# Patient Record
Sex: Female | Born: 1975 | Race: Black or African American | Hispanic: No | Marital: Married | Smoking: Never smoker
Health system: Southern US, Community
[De-identification: ages and names within clinical notes are randomized; demographics above are authoritative.]

## PROBLEM LIST (undated history)

## (undated) ENCOUNTER — Inpatient Hospital Stay (HOSPITAL_COMMUNITY): Payer: Self-pay

## (undated) DIAGNOSIS — B54 Unspecified malaria: Secondary | ICD-10-CM

## (undated) DIAGNOSIS — I10 Essential (primary) hypertension: Secondary | ICD-10-CM

## (undated) HISTORY — DX: Essential (primary) hypertension: I10

## (undated) HISTORY — PX: NO PAST SURGERIES: SHX2092

---

## 2015-09-30 ENCOUNTER — Emergency Department (HOSPITAL_COMMUNITY)
Admission: EM | Admit: 2015-09-30 | Discharge: 2015-09-30 | Disposition: A | Discharge status: Home / Self Care | Payer: Self-pay | Attending: Emergency Medicine | Admitting: Emergency Medicine

## 2015-09-30 ENCOUNTER — Encounter (HOSPITAL_COMMUNITY): Payer: Self-pay | Admitting: *Deleted

## 2015-09-30 ENCOUNTER — Emergency Department (HOSPITAL_COMMUNITY): Payer: Self-pay

## 2015-09-30 DIAGNOSIS — R000 Tachycardia, unspecified: Secondary | ICD-10-CM | POA: Insufficient documentation

## 2015-09-30 DIAGNOSIS — J111 Influenza due to unidentified influenza virus with other respiratory manifestations: Secondary | ICD-10-CM | POA: Insufficient documentation

## 2015-09-30 DIAGNOSIS — M549 Dorsalgia, unspecified: Secondary | ICD-10-CM | POA: Insufficient documentation

## 2015-09-30 DIAGNOSIS — R69 Illness, unspecified: Secondary | ICD-10-CM

## 2015-09-30 HISTORY — DX: Unspecified malaria: B54

## 2015-09-30 LAB — COMPREHENSIVE METABOLIC PANEL
ALK PHOS: 117 U/L (ref 38–126)
ALT: 53 U/L (ref 14–54)
AST: 37 U/L (ref 15–41)
Albumin: 3.6 g/dL (ref 3.5–5.0)
Anion gap: 11 (ref 5–15)
BILIRUBIN TOTAL: 0.6 mg/dL (ref 0.3–1.2)
BUN: 7 mg/dL (ref 6–20)
CALCIUM: 8.9 mg/dL (ref 8.9–10.3)
CO2: 23 mmol/L (ref 22–32)
CREATININE: 0.66 mg/dL (ref 0.44–1.00)
Chloride: 105 mmol/L (ref 101–111)
Glucose, Bld: 101 mg/dL — ABNORMAL HIGH (ref 65–99)
Potassium: 3.4 mmol/L — ABNORMAL LOW (ref 3.5–5.1)
Sodium: 139 mmol/L (ref 135–145)
TOTAL PROTEIN: 7 g/dL (ref 6.5–8.1)

## 2015-09-30 LAB — CBC WITH DIFFERENTIAL/PLATELET
Basophils Absolute: 0 10*3/uL (ref 0.0–0.1)
Basophils Relative: 1 %
EOS PCT: 19 %
Eosinophils Absolute: 1.4 10*3/uL — ABNORMAL HIGH (ref 0.0–0.7)
HEMATOCRIT: 39 % (ref 36.0–46.0)
HEMOGLOBIN: 13.7 g/dL (ref 12.0–15.0)
LYMPHS ABS: 1.5 10*3/uL (ref 0.7–4.0)
LYMPHS PCT: 20 %
MCH: 30.6 pg (ref 26.0–34.0)
MCHC: 35.1 g/dL (ref 30.0–36.0)
MCV: 87.1 fL (ref 78.0–100.0)
Monocytes Absolute: 0.5 10*3/uL (ref 0.1–1.0)
Monocytes Relative: 7 %
Neutro Abs: 3.8 10*3/uL (ref 1.7–7.7)
Neutrophils Relative %: 53 %
PLATELETS: 199 10*3/uL (ref 150–400)
RBC: 4.48 MIL/uL (ref 3.87–5.11)
RDW: 12.5 % (ref 11.5–15.5)
WBC: 7.2 10*3/uL (ref 4.0–10.5)

## 2015-09-30 LAB — PROTIME-INR
INR: 1.14 (ref 0.00–1.49)
PROTHROMBIN TIME: 14.7 s (ref 11.6–15.2)

## 2015-09-30 MED ORDER — SODIUM CHLORIDE 0.9 % IV BOLUS (SEPSIS)
1000.0000 mL | Freq: Once | INTRAVENOUS | Status: AC
  Administered 2015-09-30: 1000 mL via INTRAVENOUS

## 2015-09-30 NOTE — ED Notes (Addendum)
Used translator phone: pt just arrived to [place] 4 days ago from [place], having non productive cough/cold symptoms and lower back pain since this am and possible fever. Denies n/v or urinary symptoms. Mask on pt at triage.

## 2015-09-30 NOTE — ED Provider Notes (Signed)
CSN: [id number]     Arrival date & time 09/30/15  1245 History   First MD Initiated Contact with Patient 09/30/15 1327     Chief Complaint  Patient presents with  . Cough  . Back Pain     (Consider location/radiation/quality/duration/timing/severity/associated sxs/prior Treatment) HPI Comments: Patient with past history of malaria, recently emigrated to the [place] from a refugee camp in [place] on 09/24/15 -- presents with complaint of myalgias, headache, intermittent fever and chills starting this morning. No URI symptoms. Patient does have minor cough and complains of shortness of breath. Myalgias are more severe in her back. She denies skin rash. No nausea, vomiting, abdominal pain or diarrhea. No urinary symptoms. No treatments prior to arrival. No known sick contacts. Patient states that she did receive immunizations prior to travel.  Patient is a 40 y.o. female presenting with cough and back pain. The history is provided by the patient. A language interpreter was used (telephone, swahili).  Cough Associated symptoms: chills, fever, headaches, myalgias and shortness of breath   Associated symptoms: no chest pain, no rash, no rhinorrhea and no sore throat   Back Pain Associated symptoms: fever and headaches   Associated symptoms: no abdominal pain, no chest pain and no dysuria     Past Medical History  Diagnosis Date  . Malaria    History reviewed. No pertinent past surgical history. History reviewed. No pertinent family history. Social History  Substance Use Topics  . Smoking status: None  . Smokeless tobacco: None  . Alcohol Use: No   OB History    No data available     Review of Systems  Constitutional: Positive for fever and chills.  HENT: Negative for rhinorrhea and sore throat.   Eyes: Negative for redness.  Respiratory: Positive for cough and shortness of breath.   Cardiovascular: Negative for chest pain and leg swelling.  Gastrointestinal: Negative for  nausea, vomiting, abdominal pain and diarrhea.  Genitourinary: Negative for dysuria.  Musculoskeletal: Positive for myalgias and back pain.  Skin: Negative for rash.  Neurological: Positive for headaches.    Allergies  Review of patient's allergies indicates no known allergies.  Home Medications   Prior to Admission medications   Medication Sig Start Date End Date Taking? Authorizing Provider  PRESCRIPTION MEDICATION Take 1 tablet by mouth 2 (two) times daily.   Yes Historical Provider, MD   BP 165/119 mmHg  Pulse 103  Temp(Src) 98.7 F (37.1 C) (Oral)  Resp 18  SpO2 95%  LMP 09/16/2015   Physical Exam  Constitutional: She appears well-developed and well-nourished.  HENT:  Head: Normocephalic and atraumatic.  Mouth/Throat: Oropharynx is clear and moist.  Eyes: Right eye exhibits no discharge. Left eye exhibits no discharge.  Mild scleral injection.   Neck: Normal range of motion. Neck supple.  Cardiovascular: Regular rhythm and normal heart sounds.  Tachycardia present.   No murmur heard. Pulmonary/Chest: Effort normal and breath sounds normal. No respiratory distress. She has no wheezes. She has no rales.  Abdominal: Soft. There is no tenderness.  Neurological: She is alert.  Skin: Skin is warm and dry.  Psychiatric: She has a normal mood and affect.  Nursing note and vitals reviewed.   ED Course  Procedures (including critical care time) Labs Review Labs Reviewed  CBC WITH DIFFERENTIAL/PLATELET - Abnormal; Notable for the following:    Eosinophils Absolute 1.4 (*)    All other components within normal limits  COMPREHENSIVE METABOLIC PANEL - Abnormal; Notable for the following:  Potassium 3.4 (*)    Glucose, Bld 101 (*)    All other components within normal limits  MALARIA SMEAR  PROTIME-INR    Imaging Review Dg Chest 2 View  09/30/2015  CLINICAL DATA:  Nonproductive cough.  Cold symptoms. EXAM: CHEST  2 VIEW COMPARISON:  None. FINDINGS: Normal cardiac  silhouette and mediastinal contours. There is mild diffuse slightly nodular thickening of the pulmonary interstitium. No focal parenchymal opacities. There is minimal pleural parenchymal thickening about the bilateral major fissures. No pleural effusion or pneumothorax. No evidence of edema. No acute osseus abnormalities. IMPRESSION: Findings suggestive of airways disease. No focal airspace opacities to suggest pneumonia. Electronically Signed   By: Simonne Come M.D.   On: 09/30/2015 13:26   I have personally reviewed and evaluated these images and lab results as part of my medical decision-making.   EKG Interpretation None       1:46 PM Patient seen and examined. Work-up initiated. Medications ordered.   Vital signs reviewed and are as follows: BP 165/119 mmHg  Pulse 103  Temp(Src) 98.7 F (37.1 C) (Oral)  Resp 18  SpO2 95%  LMP 09/16/2015  4:11 PM Continuing to await preliminary Palladium smear.   Handoff to [facility] PA-C/Dr. Jeraldine Loots at shift change.   If neg, d/c to home with treatment for flu-like illness. Patient is non-toxic in appearance.    MDM   Final diagnoses:  None   Pending completion of work-up.     Renne Crigler, PA-C 09/30/15 1616  Arby Barrette, MD 10/07/15 [phone]

## 2015-09-30 NOTE — ED Notes (Signed)
Case manager from refugee organization [place] [phone] can be contacted for any questions/concerns and also for transportation home on discharge

## 2015-09-30 NOTE — Discharge Instructions (Signed)
The preliminary malaria test is negative.  Additional testing has been sent out.  You will be called if this is positive.  Influenza, Adult Influenza ("the flu") is a viral infection of the respiratory tract. It occurs more often in winter months because people spend more time in close contact with one another. Influenza can make you feel very sick. Influenza easily spreads from person to person (contagious). CAUSES  Influenza is caused by a virus that infects the respiratory tract. You can catch the virus by breathing in droplets from an infected person's cough or sneeze. You can also catch the virus by touching something that was recently contaminated with the virus and then touching your mouth, nose, or eyes. RISKS AND COMPLICATIONS You may be at risk for a more severe case of influenza if you smoke cigarettes, have diabetes, have chronic heart disease (such as heart failure) or lung disease (such as asthma), or if you have a weakened immune system. Elderly people and pregnant women are also at risk for more serious infections. The most common problem of influenza is a lung infection (pneumonia). Sometimes, this problem can require emergency medical care and may be life threatening. SIGNS AND SYMPTOMS  Symptoms typically last 4 to 10 days and may include:  Fever.  Chills.  Headache, body aches, and muscle aches.  Sore throat.  Chest discomfort and cough.  Poor appetite.  Weakness or feeling tired.  Dizziness.  Nausea or vomiting. DIAGNOSIS  Diagnosis of influenza is often made based on your history and a physical exam. A nose or throat swab test can be done to confirm the diagnosis. TREATMENT  In mild cases, influenza goes away on its own. Treatment is directed at relieving symptoms. For more severe cases, your health care provider may prescribe antiviral medicines to shorten the sickness. Antibiotic medicines are not effective because the infection is caused by a virus, not by  bacteria. HOME CARE INSTRUCTIONS  Take medicines only as directed by your health care provider.  Use a cool mist humidifier to make breathing easier.  Get plenty of rest until your temperature returns to normal. This usually takes 3 to 4 days.  Drink enough fluid to keep your urine clear or pale yellow.  Cover yourmouth and nosewhen coughing or sneezing,and wash your handswellto prevent thevirusfrom spreading.  Stay homefromwork orschool untilthe fever is gonefor at least 66full day. PREVENTION  An annual influenza vaccination (flu shot) is the best way to avoid getting influenza. An annual flu shot is now routinely recommended for all adults in the [place] SEEK MEDICAL CARE IF:  You experiencechest pain, yourcough worsens,or you producemore mucus.  Youhave nausea,vomiting, ordiarrhea.  Your fever returns or gets worse. SEEK IMMEDIATE MEDICAL CARE IF:  You havetrouble breathing, you become short of breath,or your skin ornails becomebluish.  You have severe painor stiffnessin the neck.  You develop a sudden headache, or pain in the face or ear.  You have nausea or vomiting that you cannot control. MAKE SURE YOU:   Understand these instructions.  Will watch your condition.  Will get help right away if you are not doing well or get worse.   This information is not intended to replace advice given to you by your health care provider. Make sure you discuss any questions you have with your health care provider.   Document Released: 08/16/2000 Document Revised: 09/09/2014 Document Reviewed: 11/18/2011 Elsevier Interactive Patient Education [employer]

## 2015-09-30 NOTE — ED Provider Notes (Signed)
4:00 PM Patient signed out to me at shift change by Rhea Bleacher, PA-C.  Malaria smear pending.  Plan is to consult ID if Malaria smear is positive.  Plan is to discharge home with Dx of Influenza like illness if smear is negative.  5:00 PM Lab reports that the Malaria smear is negative.  Discussed results with the patient using phone interpretor.  Patient stable for discharge.  Return precautions given.  Santiago Glad, PA-C 10/01/15 [id number]  Gerhard Munch, MD 10/01/15 [phone]

## 2015-10-02 ENCOUNTER — Encounter (HOSPITAL_COMMUNITY): Payer: Self-pay | Admitting: Emergency Medicine

## 2015-10-02 ENCOUNTER — Emergency Department (HOSPITAL_COMMUNITY)
Admission: EM | Admit: 2015-10-02 | Discharge: 2015-10-02 | Disposition: A | Discharge status: Home / Self Care | Payer: Self-pay | Attending: Emergency Medicine | Admitting: Emergency Medicine

## 2015-10-02 ENCOUNTER — Emergency Department (HOSPITAL_COMMUNITY): Payer: Self-pay

## 2015-10-02 DIAGNOSIS — Z8613 Personal history of malaria: Secondary | ICD-10-CM | POA: Insufficient documentation

## 2015-10-02 DIAGNOSIS — R0600 Dyspnea, unspecified: Secondary | ICD-10-CM | POA: Insufficient documentation

## 2015-10-02 DIAGNOSIS — R05 Cough: Secondary | ICD-10-CM | POA: Insufficient documentation

## 2015-10-02 DIAGNOSIS — R062 Wheezing: Secondary | ICD-10-CM | POA: Insufficient documentation

## 2015-10-02 DIAGNOSIS — I10 Essential (primary) hypertension: Secondary | ICD-10-CM | POA: Insufficient documentation

## 2015-10-02 DIAGNOSIS — Z79899 Other long term (current) drug therapy: Secondary | ICD-10-CM | POA: Insufficient documentation

## 2015-10-02 DIAGNOSIS — Z3202 Encounter for pregnancy test, result negative: Secondary | ICD-10-CM | POA: Insufficient documentation

## 2015-10-02 LAB — I-STAT BETA HCG BLOOD, ED (MC, WL, AP ONLY)

## 2015-10-02 LAB — CBC
HCT: 42.2 % (ref 36.0–46.0)
Hemoglobin: 14.6 g/dL (ref 12.0–15.0)
MCH: 30.4 pg (ref 26.0–34.0)
MCHC: 34.6 g/dL (ref 30.0–36.0)
MCV: 87.7 fL (ref 78.0–100.0)
Platelets: 210 10*3/uL (ref 150–400)
RBC: 4.81 MIL/uL (ref 3.87–5.11)
RDW: 12.4 % (ref 11.5–15.5)
WBC: 7.6 10*3/uL (ref 4.0–10.5)

## 2015-10-02 LAB — BASIC METABOLIC PANEL
Anion gap: 9 (ref 5–15)
BUN: 8 mg/dL (ref 6–20)
CO2: 26 mmol/L (ref 22–32)
Calcium: 9 mg/dL (ref 8.9–10.3)
Chloride: 104 mmol/L (ref 101–111)
Creatinine, Ser: 0.61 mg/dL (ref 0.44–1.00)
GFR calc Af Amer: >60 mL/min (ref >60)
GFR calc non Af Amer: >60 mL/min (ref >60)
Glucose, Bld: 98 mg/dL (ref 65–99)
Potassium: 3.3 mmol/L — ABNORMAL LOW (ref 3.5–5.1)
Sodium: 139 mmol/L (ref 135–145)

## 2015-10-02 LAB — D-DIMER, QUANTITATIVE: D-Dimer, Quant: 0.46 ug/mL-FEU (ref 0.00–0.50)

## 2015-10-02 MED ORDER — ALBUTEROL SULFATE HFA 108 (90 BASE) MCG/ACT IN AERS
2.0000 | INHALATION_SPRAY | Freq: Once | RESPIRATORY_TRACT | Status: AC
  Administered 2015-10-02: 2 via RESPIRATORY_TRACT
  Filled 2015-10-02: qty 6.7

## 2015-10-02 MED ORDER — DEXAMETHASONE SODIUM PHOSPHATE 10 MG/ML IJ SOLN
10.0000 mg | Freq: Once | INTRAMUSCULAR | Status: AC
  Administered 2015-10-02: 10 mg via INTRAMUSCULAR
  Filled 2015-10-02: qty 1

## 2015-10-02 MED ORDER — IPRATROPIUM-ALBUTEROL 0.5-2.5 (3) MG/3ML IN SOLN
3.0000 mL | Freq: Once | RESPIRATORY_TRACT | Status: AC
  Administered 2015-10-02: 3 mL via RESPIRATORY_TRACT
  Filled 2015-10-02: qty 3

## 2015-10-02 MED ORDER — IBUPROFEN 200 MG PO TABS
600.0000 mg | ORAL_TABLET | Freq: Once | ORAL | Status: AC
  Administered 2015-10-02: 600 mg via ORAL
  Filled 2015-10-02: qty 3

## 2015-10-02 MED ORDER — HYDROCHLOROTHIAZIDE 12.5 MG PO CAPS
25.0000 mg | ORAL_CAPSULE | Freq: Once | ORAL | Status: AC
  Administered 2015-10-02: 25 mg via ORAL
  Filled 2015-10-02: qty 2

## 2015-10-02 MED ORDER — HYDROCHLOROTHIAZIDE 25 MG PO TABS: 25.0000 mg | ORAL_TABLET | Freq: Every day | ORAL | Status: DC

## 2015-10-02 NOTE — Discharge Instructions (Signed)
Read the information below.  Use the prescribed medication as directed.  Please discuss all new medications with your pharmacist.  You may return to the Emergency Department at any time for worsening condition or any new symptoms that concern you.    If you develop worsening chest pain, shortness of breath, fever, you pass out, or become weak or dizzy, return to the ER for a recheck.      Kusoma maelezo hapa chini. Kutumia dawa eda kama ilivyoagizwa. Tafadhali kujadili dawa zote mpya na mfamasia wako. [place] Curet kurudi [place] ya Dharura wakati wowote kwa hali au dalili yoyote mpya ambayo wasiwasi wewe mbaya. Kama wewe Drexel Iha hali mbaya ya maumivu ya kifua, upungufu wa kupumua, homa, kupita nje, au kuwa dhaifu au [place], kurudi ER kwa [place] tena.  Hypertension Hypertension is another name for high blood pressure. High blood pressure forces your heart to work harder to pump blood. A blood pressure reading has two numbers, which includes a higher number over a lower number (example: 110/72). HOME CARE   Have your blood pressure rechecked by your doctor.  Only take medicine as told by your doctor. Follow the directions carefully. The medicine does not work as well if you skip doses. Skipping doses also puts you at risk for problems.  Do not smoke.  Monitor your blood pressure at home as told by your doctor. GET HELP IF:  You think you are having a reaction to the medicine you are taking.  You have repeat headaches or feel dizzy.  You have puffiness (swelling) in your ankles.  You have trouble with your vision. GET HELP RIGHT AWAY IF:   You get a very bad headache and are confused.  You feel weak, numb, or faint.  You get chest or belly (abdominal) pain.  You throw up (vomit).  You cannot breathe very well. MAKE SURE YOU:   Understand these instructions.  Will watch your condition.  Will get help right away if you are not doing well or get worse.   This information  is not intended to replace advice given to you by your health care provider. Make sure you discuss any questions you have with your health care provider.   Document Released: 02/05/2008 Document Revised: 08/24/2013 Document Reviewed: 06/11/2013 Elsevier Interactive Patient Education [employer]

## 2015-10-02 NOTE — ED Notes (Signed)
Monique Moon [phone] Case manager brought pt and her husband in.  Pt and her husband have lived in [place] refugee [place] 19 years.  Pt speaks swahili.  Translator phone used.  Husband speaking for patient.  Husband was instructed to allow pt to talk.   Pt states that she was seen a few days ago for few like symptoms.  Pt states that she now feels worse with [facility] and chest pain.  Pt is speaking in full sentences and does not appear to be in any distress.  Pt was worried that she has malaria but was tested for this at [facility].  Pt's case manager will return when called to come pick pt up.

## 2015-10-02 NOTE — Progress Notes (Signed)
Entered in d/c instructions Translated from [place] to Swahili  refugee case Patent examiner from refugee organization [place] [phone] can be contacted for any questions/concerns and also for transportation home on discharge   wito---wakimbizi meneja wa kesi Uchunguzi Foye Spurling shirika Ancil Boozer [phone] inaweza kupatikana kwa maswali yoyote / matatizo na pia kwa ajili ya usafiri wa nyumbani juu Monique Moon   [facility]) [address] [place], [place] [address] [phone] na 2:00 Jumatatu-Ijumaa & saini Unice Bailey dawati la mbele. [address] unahitaji [place], [place], upatikanaji wa [place], ushauri [place], resume [place], rufaa kwa ajili ya chakula / [place], Afy

## 2015-10-02 NOTE — Progress Notes (Signed)
Discussed patient with EDRN.

## 2015-10-02 NOTE — ED Provider Notes (Signed)
CSN: [id number]     Arrival date & time 10/02/15  1151 History   First MD Initiated Contact with Patient 10/02/15 1545     Chief Complaint  Patient presents with  . Chest Pain  . Shortness of Breath  . Fatigue     (Consider location/radiation/quality/duration/timing/severity/associated sxs/prior Treatment) Patient is a 40 y.o. female presenting with chest pain and shortness of breath. The history is provided by the patient and the spouse. The history is limited by a language barrier. A language interpreter was used Multimedia programmer - Swahili).  Chest Pain Associated symptoms: shortness of breath   Shortness of Breath Associated symptoms: chest pain      Pt with hx malaria recently arrived from refugee camp in [place] presents with chest soreness and SOB, dry cough, body aches with coughing that began two days ago.  Was seen in the ED two days ago and diagnosed with influenza-like illness.  During ED visit pt had negative malaria smear, negative CXR.  Deny any hx pulmonary or cardiac problems previously, denies family hx cardiac problems.    Past Medical History  Diagnosis Date  . Malaria    History reviewed. No pertinent past surgical history. History reviewed. No pertinent family history. Social History  Substance Use Topics  . Smoking status: Never Smoker   . Smokeless tobacco: None  . Alcohol Use: No   OB History    No data available     Review of Systems  Respiratory: Positive for shortness of breath.   Cardiovascular: Positive for chest pain.  All other systems reviewed and are negative.     Allergies  Review of patient's allergies indicates no known allergies.  Home Medications   Prior to Admission medications   Medication Sig Start Date End Date Taking? Authorizing Provider  PRESCRIPTION MEDICATION Take 1 tablet by mouth 2 (two) times daily.    Historical Provider, MD   BP 167/117 mmHg  Pulse 102  Temp(Src) 99.6 F (37.6 C) (Oral)  Resp 17  SpO2  98%  LMP 09/16/2015 Physical Exam  Constitutional: She appears well-developed and well-nourished. No distress.  HENT:  Head: Normocephalic and atraumatic.  Neck: Neck supple.  Cardiovascular: Normal rate and regular rhythm.   Pulmonary/Chest: Effort normal. No respiratory distress. She has wheezes. She has no rales.  Abdominal: Soft. She exhibits no distension. There is no tenderness. There is no rebound and no guarding.  Musculoskeletal: She exhibits no edema.  Neurological: She is alert.  Skin: She is not diaphoretic.  Nursing note and vitals reviewed.   ED Course  Procedures (including critical care time) Labs Review Labs Reviewed  BASIC METABOLIC PANEL - Abnormal; Notable for the following:    Potassium 3.3 (*)    All other components within normal limits  CBC  D-DIMER, QUANTITATIVE (NOT AT [facility])  I-STAT BETA HCG BLOOD, ED ([facility], [facility], [facility] ONLY)    Imaging Review Dg Chest 2 View  10/02/2015  CLINICAL DATA:  Shortness of breath, chest pain EXAM: CHEST  2 VIEW COMPARISON:  None. FINDINGS: The heart size and mediastinal contours are within normal limits. Both lungs are clear. The visualized skeletal structures are unremarkable. IMPRESSION: No active cardiopulmonary disease. Electronically Signed   By: Elige Ko   On: 10/02/2015 12:58   I have personally reviewed and evaluated these images and lab results as part of my medical decision-making.   EKG Interpretation   Date/Time:  Monday October 02 2015 12:29:33 EST Ventricular Rate:  104 PR Interval:  180 QRS Duration: 77 QT Interval:  336 QTC Calculation: 442 R Axis:   62 Text Interpretation:  Sinus tachycardia Consider right atrial enlargement  Probable LVH with secondary repol abnrm Anterolateral Q wave, probably  normal for age Confirmed by Juleen China  MD, STEPHEN ([id number]) on 10/02/2015 6:52:28  PM       5:49 PM Pt reports improvement of breathing.  Lungs now CTAB.  Would like second treatment.  They are unaware of refugee  clinic.    MDM   Final diagnoses:  Dyspnea  Essential hypertension    Nontoxic patient with 3 days of dry cough, SOB, chest soreness.  Seen in ED two days ago with negative workup, thought to be influenza-like illness.  Malaria smear was negative.  Workup today also reassuring.  D-dimer negative.  Pt wheezing on arrival today and feeling better after neb treatment.  Pt d/c home with albuterol hfa,, HCTZ for elevated blood pressure (persistent through both ED visits), close follow up in the local refugee clinic. Suspect viral cause.  Resources given by case Production designer, theatre/television/film. Discussed pt, workup, and plan with Dr Juleen China.    D/C home.  Discussed result, findings, treatment, and follow up  with patient.  Pt given return precautions.  Pt verbalizes understanding and agrees with plan.        Trixie Dredge, PA-C 10/03/15 [id number]  Raeford Razor, MD 10/04/15 [phone]

## 2015-10-02 NOTE — Progress Notes (Signed)
ED CM found this EPIC note from 09/30/15  Case manager from refugee organization [place] [phone] can be contacted for any questions/concerns and also for transportation home on discharge

## 2015-10-05 LAB — MALARIA SMEAR: Special Requests: NORMAL

## 2016-02-07 ENCOUNTER — Ambulatory Visit (INDEPENDENT_AMBULATORY_CARE_PROVIDER_SITE_OTHER): Payer: Medicaid Other | Admitting: Family Medicine

## 2016-02-07 VITALS — BP 147/98 | HR 80 | Temp 97.6°F | Ht 64.0 in | Wt 179.2 lb

## 2016-02-07 DIAGNOSIS — E669 Obesity, unspecified: Secondary | ICD-10-CM | POA: Diagnosis not present

## 2016-02-07 DIAGNOSIS — Z008 Encounter for other general examination: Secondary | ICD-10-CM | POA: Diagnosis not present

## 2016-02-07 DIAGNOSIS — Z758 Other problems related to medical facilities and other health care: Secondary | ICD-10-CM | POA: Insufficient documentation

## 2016-02-07 DIAGNOSIS — Z789 Other specified health status: Secondary | ICD-10-CM | POA: Diagnosis not present

## 2016-02-07 DIAGNOSIS — I10 Essential (primary) hypertension: Secondary | ICD-10-CM | POA: Diagnosis present

## 2016-02-07 DIAGNOSIS — Z0289 Encounter for other administrative examinations: Secondary | ICD-10-CM

## 2016-02-07 MED ORDER — HYDROCHLOROTHIAZIDE 25 MG PO TABS: 25.0000 mg | ORAL_TABLET | Freq: Every day | ORAL | Status: DC

## 2016-02-07 NOTE — Patient Instructions (Addendum)
Thank you for coming in to clinic today.  It was good to meet you. [facility]  For your next visit in 6 weeks on Wednesday July 19th at 1:30pm for next visit. We will do blood work and re-check blood pressure.  If you have any other questions or concerns, please feel free to call the clinic to contact me. You may also schedule an earlier appointment if necessary.  Saralyn PilarAlexander Karamalegos, DO [facility]

## 2016-02-07 NOTE — Assessment & Plan Note (Signed)
Elevated BP today but improved from prior 160/100, seems improved on HCTZ, seems adherent.  Plan: 1. Refilled HCTZ 25mg  daily 2. Provided brief lifestyle counseling on low salt diet and some regular physical activity 3. Follow-up repeat BP in 6 weeks, if still elevated consider adding 2nd agent with likely CCB vs ACEi

## 2016-02-07 NOTE — Progress Notes (Signed)
Swahili interpreter (Josias Gasitha, [facility]) utilized during today's visit.  Immigrant Clinic New Patient Visit  HPI:  [facility] for a new patient appointment to establish general primary care. No additional complaints.  CHRONIC HTN: Reports no prior known history HTN. No know family history of HTN. Last seen in [facility] 09/2015 had elevated BP at that time 160/110. Given rx HCTZ at that time. Current Meds - HCTZ 25mg   Reports good compliance, took meds today. Tolerating well, w/o complaints. Lifestyle - no regular exercise Denies CP, dyspnea, HA, edema, dizziness / lightheadedness  ROS: See HPI  Immigrant Social History: - Name spelling correct?: Yes - confirmed with ID card - Date arrived in [place]: 09/27/15 - Country of origin: [place] - Refugee - Location of refugee camp (if applicable), how long there, and what caused patient to leave home country?: [place] with family - Primary language: Swahili  -Requires intepreter (essentially speaks no [place]) - Education: Highest level of education: None (did not attend school) - 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  No  - if yes:  Are you having bad dreams about your experience?     Do you feel "jumpy" or "nervous?"     Do you feel that the experience is happening again?     Are you "super alert" or watchful?   Preventative Care History: -Seen at health department?: No (did not have intake labs)  Past Medical Hx:  - None prior - No hospitalizations  Past Surgical Hx:  - None  OB Hx: G9P9, all NSVD  Family Hx: updated in Epic - Number of family members:  [phone] others, husband 9 children (ages 2 yr to 3919 yr, 1 grandchild - Number of family  members in [place]:  [phone] (no other relatives)  PHYSICAL EXAM: BP 147/98 mmHg  Pulse 80  Temp(Src) 97.6 F (36.4 C) (Oral)  Ht 5\' 4"  (1.626 m)  Wt 179 lb 3.2 oz (81.285 kg)  BMI 30.74 kg/m2  LMP 02/03/2016 Gen: well-appearing, 40 yr female, obese, comfortable, cooperative HEENT: NCAT, conjunctiva clear w/o discharge, PERRL, no pallor, nares patent w/o congestion, moist mucus mem Neck:  Supple Heart: Regular rate and rhythm, no murmurs Lungs: CTAB, good air movement, no wheezing or crackles Skin:  Warm, dry Neuro: Awake, alert  Examined and interviewed with Dr. Gwendolyn GrantWalden  Problem List Items Addressed This Visit    Refugee health examination   Obesity   Language barrier   HTN (hypertension) - Primary    Elevated BP today but improved from prior 160/100, seems improved on HCTZ, seems adherent.  Plan: 1. Refilled HCTZ 25mg  daily 2. Provided brief lifestyle counseling on low salt diet and some regular physical activity 3. Follow-up repeat BP in 6 weeks, if still elevated consider adding 2nd agent with likely CCB vs ACEi      Relevant Medications   hydrochlorothiazide (HYDRODIURIL) 25 MG tablet      FOLLOW UP: F/u in 6 weeks on 03/20/16 for initial labs, BP re-check - Will need the following labs ordered at next visit: UA, Urine NAAT (GC/Chlamydia), Sickle cell screen, HIV, RPR, Hep B surface Ag, Hep C ab,  TB Quantiferon gold  Saralyn PilarAlexander Stepehn Eckard, DO [facility], PGY-3

## 2016-03-20 ENCOUNTER — Ambulatory Visit: Payer: Medicaid Other

## 2016-06-07 ENCOUNTER — Inpatient Hospital Stay (HOSPITAL_COMMUNITY)
Admission: AD | Admit: 2016-06-07 | Discharge: 2016-06-07 | Disposition: A | Discharge status: Home / Self Care | Payer: Medicaid Other | Source: Ambulatory Visit | Attending: Obstetrics & Gynecology | Admitting: Obstetrics & Gynecology

## 2016-06-07 ENCOUNTER — Encounter (HOSPITAL_COMMUNITY): Payer: Self-pay | Admitting: *Deleted

## 2016-06-07 DIAGNOSIS — Z3689 Encounter for other specified antenatal screening: Secondary | ICD-10-CM

## 2016-06-07 DIAGNOSIS — O10011 Pre-existing essential hypertension complicating pregnancy, first trimester: Secondary | ICD-10-CM | POA: Diagnosis not present

## 2016-06-07 DIAGNOSIS — I10 Essential (primary) hypertension: Secondary | ICD-10-CM | POA: Diagnosis present

## 2016-06-07 DIAGNOSIS — O10919 Unspecified pre-existing hypertension complicating pregnancy, unspecified trimester: Secondary | ICD-10-CM

## 2016-06-07 DIAGNOSIS — Z3A08 8 weeks gestation of pregnancy: Secondary | ICD-10-CM | POA: Diagnosis not present

## 2016-06-07 DIAGNOSIS — O10911 Unspecified pre-existing hypertension complicating pregnancy, first trimester: Secondary | ICD-10-CM

## 2016-06-07 LAB — URINALYSIS, ROUTINE W REFLEX MICROSCOPIC
BILIRUBIN URINE: NEGATIVE
GLUCOSE, UA: NEGATIVE mg/dL
HGB URINE DIPSTICK: NEGATIVE
Ketones, ur: NEGATIVE mg/dL
Leukocytes, UA: NEGATIVE
Nitrite: NEGATIVE
PH: 7 (ref 5.0–8.0)
Protein, ur: NEGATIVE mg/dL
SPECIFIC GRAVITY, URINE: 1.01 (ref 1.005–1.030)

## 2016-06-07 LAB — POCT PREGNANCY, URINE: PREG TEST UR: POSITIVE — AB

## 2016-06-07 MED ORDER — NIFEDIPINE 10 MG PO CAPS
20.0000 mg | ORAL_CAPSULE | Freq: Once | ORAL | Status: AC
  Administered 2016-06-07: 20 mg via ORAL
  Filled 2016-06-07: qty 2

## 2016-06-07 MED ORDER — PREPLUS 27-1 MG PO TABS: 1.0000 | ORAL_TABLET | Freq: Every day | ORAL | 13 refills | Status: DC

## 2016-06-07 MED ORDER — LABETALOL HCL 100 MG PO TABS: 100.0000 mg | ORAL_TABLET | Freq: Two times a day (BID) | ORAL | 2 refills | Status: DC

## 2016-06-07 NOTE — MAU Provider Note (Signed)
[facility] MAU Attending Note  History     CSN: [id number]  Arrival date & time 06/07/16  1950   First Provider Initiated Contact with Patient 06/07/16 2058      Chief Complaint  Patient presents with  . Hypertension    Monique Moon is a 40 y.o. [id number] at [id number] who presents to MAU today for evaluation of hypertension. She is accompanied by her husband, both are Swahili speaking only. Phone interpreter used for this encounter. [facility] reporting lower abdominal pain, back pain, and chills since 06/03/16. During evaluation there, elevated BP was noted and she was transported by EMS to MAU for HTN and headache. EMS reported BP of 170/106 and 144/100. Denies vaginal bleeding or abnormal discharge, fevers, dysuria, nausea, vomiting, other GI or GU symptoms or other general symptoms. Of note, patient denies history of HTN but was diagnosed with essential [facility] and prescribed HCTZ on which she is not taking.    Obstetric History   G9   P8   T8   P0   A0   L8    SAB0   TAB0   Ectopic0   Multiple0   Live Births0     # Outcome Date GA Lbr Len/2nd Weight Sex Delivery Anes PTL Lv  9 Current           8 Term           7 Term           6 Term           5 Term           4 Term           3 Term           2 Term           1 Term               Past Medical History:  Diagnosis Date  . HTN (hypertension)   . Malaria     Past Surgical History:  Procedure Laterality Date  . NO PAST SURGERIES      No family history on file.  Social History  Substance Use Topics  . Smoking status: Never Smoker  . Smokeless tobacco: Never Used  . Alcohol use No    No Known Allergies  Prescriptions Prior to Admission  Medication Sig Dispense Refill Last Dose  . hydrochlorothiazide (HYDRODIURIL) 25 MG tablet Take 1 tablet (25 mg total) by mouth daily. 30 tablet 5      Physical Exam  BP (!) 80/56   Pulse 100   Temp 98 F  (36.7 C) (Oral)   Resp 18   LMP 04/08/2016   SpO2 100%     Patient Vitals for the past 24 hrs:  BP Temp Temp src Pulse Resp SpO2  06/07/16 2132 124/71 97.4 F (36.3 C) Oral 83 18 99 %  06/07/16 2115 113/78 - - 88 - 100 %  06/07/16 2100 (!) 80/56 - - 100 - 100 %  06/07/16 2046 134/75 - - (!) 124 - -  06/07/16 2030 137/90 - - 73 - -  06/07/16 2017 (!) 161/109 - - 77 - 100 %  06/07/16 2003 164/98 - - 78 - 100 %  06/07/16 1959 160/93 - - 74 - 100 %  06/07/16 1950 168/98 98 F (36.7 C) Oral 77 18 100 %   GENERAL: Well-developed,  well-nourished female in no acute distress  SKIN: Warm, dry and without erythema PSYCH: Normal mood and affect HEENT: Normocephalic, atraumatic.   LUNGS: Normal respiratory effort, normal breath sounds HEART: Regular rate noted ABDOMEN: Soft, nondistended, nontender BACK: No CVAT EXTREMITIES: No edema, no cyanosis, normal range of movement  MAU Course/MDM  2025 BP 161/109; was 160-168/93-109. Nifedipine 20 mg given 2030 BP 137/90. Negative UA results discussed with patient. 2046 BP 134/75, pulse noted to be 120. No cardiac symptoms. Consulted Pharmacy about possibility of rebound tachycardia; she said it is possible. HR was in 70s.  Will give her Labetalol for home medication. 2100 BP 80/56, P 100. Definitely had exaggerated response to Nifedipine. Will observe, send home with Labetalol    Labs and Imaging   Results for orders placed or performed during the hospital encounter of 06/07/16 (from the past 24 hour(s))  Urinalysis, Routine w reflex microscopic (not at [facility])     Status: None   Collection Time: 06/07/16  8:20 PM  Result Value Ref Range   Color, Urine YELLOW YELLOW   APPearance CLEAR CLEAR   Specific Gravity, Urine 1.010 1.005 - 1.030   pH 7.0 5.0 - 8.0   Glucose, UA NEGATIVE NEGATIVE mg/dL   Hgb urine dipstick NEGATIVE NEGATIVE   Bilirubin Urine NEGATIVE NEGATIVE   Ketones, ur NEGATIVE NEGATIVE mg/dL   Protein, ur NEGATIVE NEGATIVE  mg/dL   Nitrite NEGATIVE NEGATIVE   Leukocytes, UA NEGATIVE NEGATIVE  Pregnancy, urine POC     Status: Abnormal   Collection Time: 06/07/16  8:33 PM  Result Value Ref Range   Preg Test, Ur POSITIVE (A) NEGATIVE   Bedside Ultrasound: Viable IUP at ~ [redacted] weeks GA.   Assessment and Plan   1. Preexisting hypertension complicating pregnancy, antepartum   IUP at [redacted]w[redacted]d Labetalol 100 mg po bid prescribed Prenatal vitamins prescribed Pregnancy verification letter given Will send message to [facility] to make appointment for patient given her high risk pregnancy, they will contact her with appointment details, Was told to return to MAU for any pain, bleeding or other concerns, or if her condition were to change or worsen. Discharged to home in stable condition      Medication List    STOP taking these medications   hydrochlorothiazide 25 MG tablet Commonly known as:  HYDRODIURIL     TAKE these medications   labetalol 100 MG tablet Commonly known as:  NORMODYNE Take 1 tablet (100 mg total) by mouth 2 (two) times daily.   PREPLUS 27-1 MG Tabs Take 1 tablet by mouth daily.        Jaynie Collins, MD, FACOG Attending Obstetrician & Gynecologist, [facility] for [employer], [facility]

## 2016-06-07 NOTE — MAU Note (Addendum)
Pt transported by EMS from the Urgent Care for HTN and headache. EMS reported BP of 170/106 and 144/100.  Pt also reports abd pain, back pain, chill since Monday.  LMP 04-08-16.  Denies vaginal bleeding or abnormal discharge.

## 2016-06-07 NOTE — Discharge Instructions (Signed)

## 2016-07-04 ENCOUNTER — Encounter: Payer: Medicaid Other | Admitting: Family Medicine

## 2016-07-26 ENCOUNTER — Encounter (HOSPITAL_COMMUNITY): Payer: Self-pay

## 2016-07-26 ENCOUNTER — Inpatient Hospital Stay (HOSPITAL_COMMUNITY)
Admission: AD | Admit: 2016-07-26 | Discharge: 2016-07-26 | Disposition: A | Discharge status: Home / Self Care | Payer: Medicaid Other | Source: Ambulatory Visit | Attending: Obstetrics & Gynecology | Admitting: Obstetrics & Gynecology

## 2016-07-26 ENCOUNTER — Ambulatory Visit (HOSPITAL_COMMUNITY)
Admission: EM | Admit: 2016-07-26 | Discharge: 2016-07-26 | Disposition: A | Discharge status: Home / Self Care | Payer: Medicaid Other

## 2016-07-26 DIAGNOSIS — O10912 Unspecified pre-existing hypertension complicating pregnancy, second trimester: Secondary | ICD-10-CM | POA: Diagnosis not present

## 2016-07-26 DIAGNOSIS — Z79899 Other long term (current) drug therapy: Secondary | ICD-10-CM | POA: Diagnosis not present

## 2016-07-26 DIAGNOSIS — Z3A16 16 weeks gestation of pregnancy: Secondary | ICD-10-CM | POA: Diagnosis not present

## 2016-07-26 DIAGNOSIS — O162 Unspecified maternal hypertension, second trimester: Secondary | ICD-10-CM | POA: Insufficient documentation

## 2016-07-26 DIAGNOSIS — O0932 Supervision of pregnancy with insufficient antenatal care, second trimester: Secondary | ICD-10-CM | POA: Diagnosis not present

## 2016-07-26 LAB — URINALYSIS, ROUTINE W REFLEX MICROSCOPIC
Bilirubin Urine: NEGATIVE
Glucose, UA: NEGATIVE mg/dL
Hgb urine dipstick: NEGATIVE
Ketones, ur: NEGATIVE mg/dL
LEUKOCYTES UA: NEGATIVE
NITRITE: NEGATIVE
PH: 8.5 — AB (ref 5.0–8.0)
PROTEIN: 30 mg/dL — AB
SPECIFIC GRAVITY, URINE: 1.015 (ref 1.005–1.030)

## 2016-07-26 LAB — URINE MICROSCOPIC-ADD ON: RBC / HPF: NONE SEEN RBC/hpf (ref 0–5)

## 2016-07-26 LAB — OB RESULTS CONSOLE GC/CHLAMYDIA
CHLAMYDIA, DNA PROBE: NEGATIVE
Gonorrhea: NEGATIVE

## 2016-07-26 NOTE — Discharge Instructions (Signed)
Prenatal Care WHAT IS PRENATAL CARE? Prenatal care is the process of caring for a pregnant woman before she gives birth. Prenatal care makes sure that she and her baby remain as healthy as possible throughout pregnancy. Prenatal care may be provided by a midwife, family practice health care provider, or a childbirth and pregnancy specialist (obstetrician). Prenatal care may include physical examinations, testing, treatments, and education on nutrition, lifestyle, and social support services. WHY IS PRENATAL CARE SO IMPORTANT? Early and consistent prenatal care increases the chance that you and your baby will remain healthy throughout your pregnancy. This type of care also decreases a babys risk of being born too early (prematurely), or being born smaller than expected (small for gestational age). Any underlying medical conditions you may have that could pose a risk during your pregnancy are discussed during prenatal care visits. You will also be monitored regularly for any new conditions that may arise during your pregnancy so they can be treated quickly and effectively. WHAT HAPPENS DURING PRENATAL CARE VISITS? Prenatal care visits may include the following: Discussion Tell your health care provider about any new signs or symptoms you have experienced since your last visit. These might include: Nausea or vomiting. Increased or decreased level of energy. Difficulty sleeping. Back or leg pain. Weight changes. Frequent urination. Shortness of breath with physical activity. Changes in your skin, such as the development of a rash or itchiness. Vaginal discharge or bleeding. Feelings of excitement or nervousness. Changes in your babys movements. You may want to write down any questions or topics you want to discuss with your health care provider and bring them with you to your appointment. Examination During your first prenatal care visit, you will likely have a complete physical exam. Your health  care provider will often examine your vagina, cervix, and the position of your uterus, as well as check your heart, lungs, and other body systems. As your pregnancy progresses, your health care provider will measure the size of your uterus and your babys position inside your uterus. He or she may also examine you for early signs of labor. Your prenatal visits may also include checking your blood pressure and, after about 10-12 weeks of pregnancy, listening to your babys heartbeat. Testing Regular testing often includes: Urinalysis. This checks your urine for glucose, protein, or signs of infection. Blood count. This checks the levels of white and red blood cells in your body. Tests for sexually transmitted infections (STIs). Testing for STIs at the beginning of pregnancy is routinely done and is required in many states. Antibody testing. You will be checked to see if you are immune to certain illnesses, such as rubella, that can affect a developing fetus. Glucose screen. Around 24-28 weeks of pregnancy, your blood glucose level will be checked for signs of gestational diabetes. Follow-up tests may be recommended. Group B strep. This is a bacteria that is commonly found inside a womans vagina. This test will inform your health care provider if you need an antibiotic to reduce the amount of this bacteria in your body prior to labor and childbirth. Ultrasound. Many pregnant women undergo an ultrasound screening around 18-20 weeks of pregnancy to evaluate the health of the fetus and check for any developmental abnormalities. HIV (human immunodeficiency virus) testing. Early in your pregnancy, you will be screened for HIV. If you are at high risk for HIV, this test may be repeated during your third trimester of pregnancy. You may be offered other testing based on your age, personal or  family medical history, or other factors. HOW OFTEN SHOULD I PLAN TO SEE MY HEALTH CARE PROVIDER FOR PRENATAL CARE? Your  prenatal care check-up schedule depends on any medical conditions you have before, or develop during, your pregnancy. If you do not have any underlying medical conditions, you will likely be seen for checkups: Monthly, during the first 6 months of pregnancy. Twice a month during months 7 and 8 of pregnancy. Weekly starting in the 9th month of pregnancy and until delivery. If you develop signs of early labor or other concerning signs or symptoms, you may need to see your health care provider more often. Ask your health care provider what prenatal care schedule is best for you. WHAT CAN I DO TO KEEP MYSELF AND MY BABY AS HEALTHY AS POSSIBLE DURING MY PREGNANCY? Take a prenatal vitamin containing 400 micrograms (0.4 mg) of folic acid every day. Your health care provider may also ask you to take additional vitamins such as iodine, vitamin D, iron, copper, and zinc. Take 1500-2000 mg of calcium daily starting at your 20th week of pregnancy until you deliver your baby. Make sure you are up to date on your vaccinations. Unless directed otherwise by your health care provider: You should receive a tetanus, diphtheria, and pertussis (Tdap) vaccination between the 27th and 36th week of your pregnancy, regardless of when your last Tdap immunization occurred. This helps protect your baby from whooping cough (pertussis) after he or she is born. You should receive an annual inactivated influenza vaccine (IIV) to help protect you and your baby from influenza. This can be done at any point during your pregnancy. Eat a well-rounded diet that includes: Fresh fruits and vegetables. Lean proteins. Calcium-rich foods such as milk, yogurt, hard cheeses, and dark, leafy greens. Whole grain breads. Do noteat seafood high in mercury, including: Swordfish. Tilefish. Shark. King mackerel. More than 6 oz tuna per week. Do not eat: Raw or undercooked meats or eggs. Unpasteurized foods, such as soft cheeses (brie, blue,  or feta), juices, and milks. Lunch meats. Hot dogs that have not been heated until they are steaming. Drink enough water to keep your urine clear or pale yellow. For many women, this may be 10 or more 8 oz glasses of water each day. Keeping yourself hydrated helps deliver nutrients to your baby and may prevent the start of pre-term uterine contractions. Do not use any tobacco products including cigarettes, chewing tobacco, or electronic cigarettes. If you need help quitting, ask your health care provider. Do not drink beverages containing alcohol. No safe level of alcohol consumption during pregnancy has been determined. Do not use any illegal drugs. These can harm your developing baby or cause a miscarriage. Ask your health care provider or pharmacist before taking any prescription or over-the-counter medicines, herbs, or supplements. Limit your caffeine intake to no more than 200 mg per day. Exercise. Unless told otherwise by your health care provider, try to get 30 minutes of moderate exercise most days of the week. Do not  do high-impact activities, contact sports, or activities with a high risk of falling, such as horseback riding or downhill skiing. Get plenty of rest. Avoid anything that raises your body temperature, such as hot tubs and saunas. If you own a cat, do not empty its litter box. Bacteria contained in cat feces can cause an infection called toxoplasmosis. This can result in serious harm to the fetus. Stay away from chemicals such as insecticides, lead, mercury, and cleaning or paint products that contain  solvents. Do not have any X-rays taken unless medically necessary. Take a childbirth and breastfeeding preparation class. Ask your health care provider if you need a referral or recommendation. This information is not intended to replace advice given to you by your health care provider. Make sure you discuss any questions you have with your health care provider. Document Released:  08/22/2003 Document Revised: 01/22/2016 Document Reviewed: 11/03/2013 Elsevier Interactive Patient Education  2017 [employer]. Second Trimester of Pregnancy The second trimester is from week 13 through week 28 (months 4 through 6). The second trimester is often a time when you feel your best. Your body has also adjusted to being pregnant, and you begin to feel better physically. Usually, morning sickness has lessened or quit completely, you may have more energy, and you may have an increase in appetite. The second trimester is also a time when the fetus is growing rapidly. At the end of the sixth month, the fetus is about 9 inches long and weighs about 1 pounds. You will likely begin to feel the baby move (quickening) between 18 and 20 weeks of the pregnancy. Body changes during your second trimester Your body continues to go through many changes during your second trimester. The changes vary from woman to woman.  Your weight will continue to increase. You will notice your lower abdomen bulging out.  You may begin to get stretch marks on your hips, abdomen, and breasts.  You may develop headaches that can be relieved by medicines. The medicines should be approved by your health care provider.  You may urinate more often because the fetus is pressing on your bladder.  You may develop or continue to have heartburn as a result of your pregnancy.  You may develop constipation because certain hormones are causing the muscles that push waste through your intestines to slow down.  You may develop hemorrhoids or swollen, bulging veins (varicose veins).  You may have back pain. This is caused by:  Weight gain.  Pregnancy hormones that are relaxing the joints in your pelvis.  A shift in weight and the muscles that support your balance.  Your breasts will continue to grow and they will continue to become tender.  Your gums may bleed and may be sensitive to brushing and flossing.  Dark spots  or blotches (chloasma, mask of pregnancy) may develop on your face. This will likely fade after the baby is born.  A dark line from your belly button to the pubic area (linea nigra) may appear. This will likely fade after the baby is born.  You may have changes in your hair. These can include thickening of your hair, rapid growth, and changes in texture. Some women also have hair loss during or after pregnancy, or hair that feels dry or thin. Your hair will most likely return to normal after your baby is born. What to expect at prenatal visits During a routine prenatal visit:  You will be weighed to make sure you and the fetus are growing normally.  Your blood pressure will be taken.  Your abdomen will be measured to track your baby's growth.  The fetal heartbeat will be listened to.  Any test results from the previous visit will be discussed. Your health care provider may ask you:  How you are feeling.  If you are feeling the baby move.  If you have had any abnormal symptoms, such as leaking fluid, bleeding, severe headaches, or abdominal cramping.  If you are using any tobacco products,  including cigarettes, chewing tobacco, and electronic cigarettes.  If you have any questions. Other tests that may be performed during your second trimester include:  Blood tests that check for:  Low iron levels (anemia).  Gestational diabetes (between 24 and 28 weeks).  Rh antibodies. This is to check for a protein on red blood cells (Rh factor).  Urine tests to check for infections, diabetes, or protein in the urine.  An ultrasound to confirm the proper growth and development of the baby.  An amniocentesis to check for possible genetic problems.  Fetal screens for spina bifida and Down syndrome.  HIV (human immunodeficiency virus) testing. Routine prenatal testing includes screening for HIV, unless you choose not to have this test. Follow these instructions at home: Eating and  drinking  Continue to eat regular, healthy meals.  Avoid raw meat, uncooked cheese, cat litter boxes, and soil used by cats. These carry germs that can cause birth defects in the baby.  Take your prenatal vitamins.  Take 1500-2000 mg of calcium daily starting at the 20th week of pregnancy until you deliver your baby.  If you develop constipation:  Take over-the-counter or prescription medicines.  Drink enough fluid to keep your urine clear or pale yellow.  Eat foods that are high in fiber, such as fresh fruits and vegetables, whole grains, and beans.  Limit foods that are high in fat and processed sugars, such as fried and sweet foods. Activity  Exercise only as directed by your health care provider. Experiencing uterine cramps is a good sign to stop exercising.  Avoid heavy lifting, wear low heel shoes, and practice good posture.  Wear your seat belt at all times when driving.  Rest with your legs elevated if you have leg cramps or low back pain.  Wear a good support bra for breast tenderness.  Do not use hot tubs, steam rooms, or saunas. Lifestyle  Avoid all smoking, herbs, alcohol, and unprescribed drugs. These chemicals affect the formation and growth of the baby.  Do not use any products that contain nicotine or tobacco, such as cigarettes and e-cigarettes. If you need help quitting, ask your health care provider.  A sexual relationship may be continued unless your health care provider directs you otherwise. General instructions  Follow your health care provider's instructions regarding medicine use. There are medicines that are either safe or unsafe to take during pregnancy.  Take warm sitz baths to soothe any pain or discomfort caused by hemorrhoids. Use hemorrhoid cream if your health care provider approves.  If you develop varicose veins, wear support hose. Elevate your feet for 15 minutes, 3-4 times a day. Limit salt in your diet.  Visit your dentist if you  have not gone yet during your pregnancy. Use a soft toothbrush to brush your teeth and be gentle when you floss.  Keep all follow-up prenatal visits as told by your health care provider. This is important. Contact a health care provider if:  You have dizziness.  You have mild pelvic cramps, pelvic pressure, or nagging pain in the abdominal area.  You have persistent nausea, vomiting, or diarrhea.  You have a bad smelling vaginal discharge.  You have pain with urination. Get help right away if:  You have a fever.  You are leaking fluid from your vagina.  You have spotting or bleeding from your vagina.  You have severe abdominal cramping or pain.  You have rapid weight gain or weight loss.  You have shortness of breath with chest  pain.  You notice sudden or extreme swelling of your face, hands, ankles, feet, or legs.  You have not felt your baby move in over an hour.  You have severe headaches that do not go away with medicine.  You have vision changes. Summary  The second trimester is from week 13 through week 28 (months 4 through 6). It is also a time when the fetus is growing rapidly.  Your body goes through many changes during pregnancy. The changes vary from woman to woman.  Avoid all smoking, herbs, alcohol, and unprescribed drugs. These chemicals affect the formation and growth your baby.  Do not use any tobacco products, such as cigarettes, chewing tobacco, and e-cigarettes. If you need help quitting, ask your health care provider.  Contact your health care provider if you have any questions. Keep all prenatal visits as told by your health care provider. This is important. This information is not intended to replace advice given to you by your health care provider. Make sure you discuss any questions you have with your health care provider. Document Released: 08/13/2001 Document Revised: 01/25/2016 Document Reviewed: 10/20/2012 Elsevier Interactive Patient  Education  2017 [employer]    Patient to call [facility] AT [phone] with english speaking friend to make appointment. Message sent to [employer] to follow up as well.

## 2016-07-26 NOTE — Progress Notes (Signed)
E-signature not working, pt signed paper

## 2016-07-26 NOTE — MAU Provider Note (Signed)
  History    Patient Monique Moon is a 40 year old [id number] at approximately 16 weeks and 3 days. She is here because she missed her prenatal appointment in early November because she says she was not aware of her appointment. She is here now because she wants to check on the baby. Her maternal history is significant for chronic hypertension; [facility] 8 [facility] (october 2017). She was prescribed labetelol 100 BID at that time. She denies bleeding, leaking of fluid, blurry vision. She reports no other complaints at this time.     CSN: [id number]  Arrival date and time: 07/26/16 1541   None     No chief complaint on file.  HPI  OB History    Gravida Para Term Preterm AB Living   9 8 8     8    SAB TAB Ectopic Multiple Live Births                  Past Medical History:  Diagnosis Date  . HTN (hypertension)   . Malaria     Past Surgical History:  Procedure Laterality Date  . NO PAST SURGERIES      No family history on file.  Social History  Substance Use Topics  . Smoking status: Never Smoker  . Smokeless tobacco: Never Used  . Alcohol use No    Allergies: No Known Allergies  Prescriptions Prior to Admission  Medication Sig Dispense Refill Last Dose  . labetalol (NORMODYNE) 100 MG tablet Take 1 tablet (100 mg total) by mouth 2 (two) times daily. 60 tablet 2   . Prenatal Vit-Fe Fumarate-FA (PREPLUS) 27-1 MG TABS Take 1 tablet by mouth daily. 30 tablet 13     Review of Systems  Constitutional: Negative.   HENT: Negative.   Eyes: Negative.   Respiratory: Negative.   Cardiovascular: Negative.   Gastrointestinal: Negative.   Genitourinary: Negative.   Skin: Negative.   Neurological: Negative.   Endo/Heme/Allergies: Negative.    Physical Exam   Blood pressure 144/87, pulse 73, temperature 98.4 F (36.9 C), temperature source Oral, resp. rate 18, height 5\' 5"  (1.651 m), weight 82.1 kg (181 lb), last menstrual period  04/02/2016.  Physical Exam  Constitutional: She is oriented to person, place, and time. She appears well-developed and well-nourished.  HENT:  Head: Normocephalic.  Respiratory: Effort normal. No respiratory distress. She has no wheezes. She has no rales. She exhibits no tenderness.  GI: She exhibits no distension and no mass. There is no tenderness. There is no rebound and no guarding.  Musculoskeletal: Normal range of motion.  Neurological: She is alert and oriented to person, place, and time. She has normal reflexes.  Skin: Skin is warm and dry.    MAU Course  Procedures  MDM FHR of 160 Gonorrhea and chlamydia cultures sent  Assessment and Plan  Patient Monique Moon is a 40 year old [id number] here to check on the baby after missing her prenatal appointment in early November. Positive fetal heart tones today, blood pressure is normal. All communication took place via Swahili interpreter phone.   Plan:  -Message sent to [facility] to call her with Swahili interpreter for prenatal appointment -GC and CT sent -labetelol prescription to be renewed -warning signs of pregnancy reviewed (bleeding, epigastric pain, headache, leaking of fluid) Patient and husband verbalized understanding.    Charlesetta GaribaldiKathryn Lorraine Kooistra CNM 07/26/2016, 5:40 PM

## 2016-07-31 ENCOUNTER — Encounter: Payer: Self-pay | Admitting: Family Medicine

## 2016-07-31 ENCOUNTER — Encounter: Payer: Self-pay | Admitting: Obstetrics & Gynecology

## 2016-07-31 LAB — GC/CHLAMYDIA PROBE AMP (~~LOC~~) NOT AT ARMC
CHLAMYDIA, DNA PROBE: NEGATIVE
Neisseria Gonorrhea: NEGATIVE

## 2016-08-16 ENCOUNTER — Ambulatory Visit (INDEPENDENT_AMBULATORY_CARE_PROVIDER_SITE_OTHER): Payer: Medicaid Other | Admitting: Family Medicine

## 2016-08-16 ENCOUNTER — Encounter: Payer: Self-pay | Admitting: Family Medicine

## 2016-08-16 ENCOUNTER — Other Ambulatory Visit (HOSPITAL_COMMUNITY)
Admission: RE | Admit: 2016-08-16 | Discharge: 2016-08-16 | Disposition: A | Discharge status: Home / Self Care | Payer: BLUE CROSS/BLUE SHIELD | Source: Ambulatory Visit | Attending: Family Medicine | Admitting: Family Medicine

## 2016-08-16 VITALS — BP 154/90 | HR 84 | Wt 185.3 lb

## 2016-08-16 DIAGNOSIS — O10912 Unspecified pre-existing hypertension complicating pregnancy, second trimester: Secondary | ICD-10-CM

## 2016-08-16 DIAGNOSIS — Z641 Problems related to multiparity: Secondary | ICD-10-CM

## 2016-08-16 DIAGNOSIS — Z1151 Encounter for screening for human papillomavirus (HPV): Secondary | ICD-10-CM | POA: Insufficient documentation

## 2016-08-16 DIAGNOSIS — O0990 Supervision of high risk pregnancy, unspecified, unspecified trimester: Secondary | ICD-10-CM | POA: Insufficient documentation

## 2016-08-16 DIAGNOSIS — O09522 Supervision of elderly multigravida, second trimester: Secondary | ICD-10-CM | POA: Diagnosis not present

## 2016-08-16 DIAGNOSIS — O10919 Unspecified pre-existing hypertension complicating pregnancy, unspecified trimester: Secondary | ICD-10-CM

## 2016-08-16 DIAGNOSIS — Z23 Encounter for immunization: Secondary | ICD-10-CM | POA: Diagnosis not present

## 2016-08-16 DIAGNOSIS — Z01419 Encounter for gynecological examination (general) (routine) without abnormal findings: Secondary | ICD-10-CM | POA: Diagnosis present

## 2016-08-16 DIAGNOSIS — I10 Essential (primary) hypertension: Secondary | ICD-10-CM | POA: Diagnosis not present

## 2016-08-16 LAB — POCT URINALYSIS DIP (DEVICE)
Bilirubin Urine: NEGATIVE
GLUCOSE, UA: NEGATIVE mg/dL
Hgb urine dipstick: NEGATIVE
Ketones, ur: NEGATIVE mg/dL
LEUKOCYTES UA: NEGATIVE
Nitrite: NEGATIVE
Protein, ur: NEGATIVE mg/dL
SPECIFIC GRAVITY, URINE: 1.02 (ref 1.005–1.030)
UROBILINOGEN UA: 0.2 mg/dL (ref 0.0–1.0)
pH: 7 (ref 5.0–8.0)

## 2016-08-16 LAB — COMPREHENSIVE METABOLIC PANEL
ALK PHOS: 52 U/L (ref 33–115)
ALT: 9 U/L (ref 6–29)
AST: 18 U/L (ref 10–30)
Albumin: 3.3 g/dL — ABNORMAL LOW (ref 3.6–5.1)
BILIRUBIN TOTAL: 0.4 mg/dL (ref 0.2–1.2)
BUN: 4 mg/dL — AB (ref 7–25)
CALCIUM: 8.4 mg/dL — AB (ref 8.6–10.2)
CO2: 25 mmol/L (ref 20–31)
CREATININE: 0.47 mg/dL — AB (ref 0.50–1.10)
Chloride: 105 mmol/L (ref 98–110)
GLUCOSE: 86 mg/dL (ref 65–99)
Potassium: 3.8 mmol/L (ref 3.5–5.3)
Sodium: 138 mmol/L (ref 135–146)
Total Protein: 6.2 g/dL (ref 6.1–8.1)

## 2016-08-16 LAB — TSH: TSH: 2.07 mIU/L

## 2016-08-16 MED ORDER — LABETALOL HCL 100 MG PO TABS: 100.0000 mg | ORAL_TABLET | Freq: Two times a day (BID) | ORAL | 2 refills | Status: DC

## 2016-08-16 MED ORDER — ASPIRIN EC 81 MG PO TBEC: 81.0000 mg | DELAYED_RELEASE_TABLET | Freq: Every day | ORAL | 1 refills | Status: DC

## 2016-08-16 NOTE — Progress Notes (Signed)
Subjective:    Glynn OctaveFitina Piloto is a [id number] 2972w3d being seen today for her first obstetrical visit.  Her obstetrical history is significant for advanced maternal age and HTN, and grandmultiparity. Patient does intend to breast feed. Pregnancy history fully reviewed.  Patient reports no complaints.  Vitals:   08/16/16 1002 08/16/16 1010  BP: (!) 147/94 (!) 154/90  Pulse: 84   Weight: 185 lb 4.8 oz (84.1 kg)     HISTORY: OB History  Gravida Para Term Preterm AB Living  9 8 8     8   SAB TAB Ectopic Multiple Live Births          8    # Outcome Date GA Lbr Len/2nd Weight Sex Delivery Anes PTL Lv  9 Current           8 Term 2015 2313w0d    Vag-Spont   LIV     Birth Comments: no complications, born in [place]  7 Term 2012 4113w0d    Vag-Spont   LIV     Birth Comments: no complications, born in [place]  6 Term 2010 5113w0d    Vag-Spont   LIV     Birth Comments: no complications, born in [place]  5 Term 2008 4213w0d    Vag-Spont   LIV     Birth Comments: born in [place], no complications.   4 Term 2005 7413w0d    Vag-Spont   LIV     Birth Comments: no complications, born in [place]  3 Term 08/10/02 2113w0d    Vag-Spont   LIV     Birth Comments: no complications, born in [place]  2 Term 1999 7313w0d    Vag-Spont   LIV     Birth Comments: born in [place].no complications.   1 Term 1998 6713w0d    Vag-Spont   LIV     Birth Comments: no complications, born in [place].      Past Medical History:  Diagnosis Date  . HTN (hypertension)   . Malaria    Past Surgical History:  Procedure Laterality Date  . NO PAST SURGERIES     History reviewed. No pertinent family history.   Exam    Uterus:     Pelvic Exam:    Perineum: Normal Perineum   Vulva: Bartholin's, Urethra, Skene's normal   Vagina:  normal mucosa, normal discharge   Cervix: multiparous appearance and no lesions   Adnexa: normal adnexa   Bony Pelvis: average  System: Breast:  normal appearance, no masses or tenderness   Skin: normal coloration and turgor, no rashes    Neurologic: normal mood   Extremities: normal strength, tone, and muscle mass   HEENT extra ocular movement intact, sclera clear, anicteric and oropharynx clear, no lesions   Mouth/Teeth mucous membranes moist, pharynx normal without lesions   Neck supple   Cardiovascular: regular rate and rhythm, no murmurs or gallops   Respiratory:  appears well, vitals normal, no respiratory distress, acyanotic, normal RR, ear and throat exam is normal, neck free of mass or lymphadenopathy, chest clear, no wheezing, crepitations, rhonchi, normal symmetric air entry   Abdomen: soft, non-tender; bowel sounds normal; no masses,  no organomegaly      Assessment/Plan:   1. Preexisting hypertension complicating pregnancy, antepartum - ASA 81 mg po daily - labetalol (NORMODYNE) 100 MG tablet; Take 1 tablet (100 mg total) by mouth 2 (two) times daily.  Dispense: 60 tablet; Refill: 2 - Comprehensive metabolic panel - Protein / creatinine ratio, urine -  TSH  2. Supervision of high risk pregnancy, antepartum New OB labs - Hemoglobinopathy Evaluation - Prenatal Profile - Pain Mgmt, Profile 6 Conf w/o mM, U - Cytology - PAP - Culture, OB Urine - US MFM OB COMP + 14 WK; Future - Flu Vaccine QUAD 36+ mos IM (Fluarix, Quad PF)  3. Grand multipara At risk of PPH  4. Elderly multigravida in second trimester - AMB MFM GENETICS REFERRAL  5. Essential hypertension Previously on HCTZ  Reva Boresanya S Kazuma Elena 08/16/2016

## 2016-08-16 NOTE — Patient Instructions (Addendum)
Also recommend maternity support belt from [employer] [place] or maternity store.  Second Trimester of Pregnancy The second trimester is from week 13 through week 28 (months 4 through 6). The second trimester is often a time when you feel your best. Your body has also adjusted to being pregnant, and you begin to feel better physically. Usually, morning sickness has lessened or quit completely, you may have more energy, and you may have an increase in appetite. The second trimester is also a time when the fetus is growing rapidly. At the end of the sixth month, the fetus is about 9 inches long and weighs about 1 pounds. You will likely begin to feel the baby move (quickening) between 18 and 20 weeks of the pregnancy. Body changes during your second trimester Your body continues to go through many changes during your second trimester. The changes vary from woman to woman.  Your weight will continue to increase. You will notice your lower abdomen bulging out.  You may begin to get stretch marks on your hips, abdomen, and breasts.  You may develop headaches that can be relieved by medicines. The medicines should be approved by your health care provider.  You may urinate more often because the fetus is pressing on your bladder.  You may develop or continue to have heartburn as a result of your pregnancy.  You may develop constipation because certain hormones are causing the muscles that push waste through your intestines to slow down.  You may develop hemorrhoids or swollen, bulging veins (varicose veins).  You may have back pain. This is caused by:  Weight gain.  Pregnancy hormones that are relaxing the joints in your pelvis.  A shift in weight and the muscles that support your balance.  Your breasts will continue to grow and they will continue to become tender.  Your gums may bleed and may be sensitive to brushing and flossing.  Dark spots or blotches (chloasma, mask of pregnancy) may develop  on your face. This will likely fade after the baby is born.  A dark line from your belly button to the pubic area (linea nigra) may appear. This will likely fade after the baby is born.  You may have changes in your hair. These can include thickening of your hair, rapid growth, and changes in texture. Some women also have hair loss during or after pregnancy, or hair that feels dry or thin. Your hair will most likely return to normal after your baby is born. What to expect at prenatal visits During a routine prenatal visit:  You will be weighed to make sure you and the fetus are growing normally.  Your blood pressure will be taken.  Your abdomen will be measured to track your baby's growth.  The fetal heartbeat will be listened to.  Any test results from the previous visit will be discussed. Your health care provider may ask you:  How you are feeling.  If you are feeling the baby move.  If you have had any abnormal symptoms, such as leaking fluid, bleeding, severe headaches, or abdominal cramping.  If you are using any tobacco products, including cigarettes, chewing tobacco, and electronic cigarettes.  If you have any questions. Other tests that may be performed during your second trimester include:  Blood tests that check for:  Low iron levels (anemia).  Gestational diabetes (between 24 and 28 weeks).  Rh antibodies. This is to check for a protein on red blood cells (Rh factor).  Urine tests to  check for infections, diabetes, or protein in the urine.  An ultrasound to confirm the proper growth and development of the baby.  An amniocentesis to check for possible genetic problems.  Fetal screens for spina bifida and Down syndrome.  HIV (human immunodeficiency virus) testing. Routine prenatal testing includes screening for HIV, unless you choose not to have this test. Follow these instructions at home: Eating and drinking  Continue to eat regular, healthy  meals.  Avoid raw meat, uncooked cheese, cat litter boxes, and soil used by cats. These carry germs that can cause birth defects in the baby.  Take your prenatal vitamins.  Take 1500-2000 mg of calcium daily starting at the 20th week of pregnancy until you deliver your baby.  If you develop constipation:  Take over-the-counter or prescription medicines.  Drink enough fluid to keep your urine clear or pale yellow.  Eat foods that are high in fiber, such as fresh fruits and vegetables, whole grains, and beans.  Limit foods that are high in fat and processed sugars, such as fried and sweet foods. Activity  Exercise only as directed by your health care provider. Experiencing uterine cramps is a good sign to stop exercising.  Avoid heavy lifting, wear low heel shoes, and practice good posture.  Wear your seat belt at all times when driving.  Rest with your legs elevated if you have leg cramps or low back pain.  Wear a good support bra for breast tenderness.  Do not use hot tubs, steam rooms, or saunas. Lifestyle  Avoid all smoking, herbs, alcohol, and unprescribed drugs. These chemicals affect the formation and growth of the baby.  Do not use any products that contain nicotine or tobacco, such as cigarettes and e-cigarettes. If you need help quitting, ask your health care provider.  A sexual relationship may be continued unless your health care provider directs you otherwise. General instructions  Follow your health care provider's instructions regarding medicine use. There are medicines that are either safe or unsafe to take during pregnancy.  Take warm sitz baths to soothe any pain or discomfort caused by hemorrhoids. Use hemorrhoid cream if your health care provider approves.  If you develop varicose veins, wear support hose. Elevate your feet for 15 minutes, 3-4 times a day. Limit salt in your diet.  Visit your dentist if you have not gone yet during your pregnancy. Use a  soft toothbrush to brush your teeth and be gentle when you floss.  Keep all follow-up prenatal visits as told by your health care provider. This is important. Contact a health care provider if:  You have dizziness.  You have mild pelvic cramps, pelvic pressure, or nagging pain in the abdominal area.  You have persistent nausea, vomiting, or diarrhea.  You have a bad smelling vaginal discharge.  You have pain with urination. Get help right away if:  You have a fever.  You are leaking fluid from your vagina.  You have spotting or bleeding from your vagina.  You have severe abdominal cramping or pain.  You have rapid weight gain or weight loss.  You have shortness of breath with chest pain.  You notice sudden or extreme swelling of your face, hands, ankles, feet, or legs.  You have not felt your baby move in over an hour.  You have severe headaches that do not go away with medicine.  You have vision changes. Summary  The second trimester is from week 13 through week 28 (months 4 through 6). It is  also a time when the fetus is growing rapidly.  Your body goes through many changes during pregnancy. The changes vary from woman to woman.  Avoid all smoking, herbs, alcohol, and unprescribed drugs. These chemicals affect the formation and growth your baby.  Do not use any tobacco products, such as cigarettes, chewing tobacco, and e-cigarettes. If you need help quitting, ask your health care provider.  Contact your health care provider if you have any questions. Keep all prenatal visits as told by your health care provider. This is important. This information is not intended to replace advice given to you by your health care provider. Make sure you discuss any questions you have with your health care provider. Document Released: 08/13/2001 Document Revised: 01/25/2016 Document Reviewed: 10/20/2012 Elsevier Interactive Patient Education  2017 [employer].   Breastfeeding Deciding to breastfeed is one of the best choices you can make for you and your baby. A change in hormones during pregnancy causes your breast tissue to grow and increases the number and size of your milk ducts. These hormones also allow proteins, sugars, and fats from your blood supply to make breast milk in your milk-producing glands. Hormones prevent breast milk from being released before your baby is born as well as prompt milk flow after birth. Once breastfeeding has begun, thoughts of your baby, as well as his or her sucking or crying, can stimulate the release of milk from your milk-producing glands. Benefits of breastfeeding For Your Baby  Your first milk (colostrum) helps your baby's digestive system function better.  There are antibodies in your milk that help your baby fight off infections.  Your baby has a lower incidence of asthma, allergies, and sudden infant death syndrome.  The nutrients in breast milk are better for your baby than infant formulas and are designed uniquely for your baby's needs.  Breast milk improves your baby's brain development.  Your baby is less likely to develop other conditions, such as childhood obesity, asthma, or type 2 diabetes mellitus. For You  Breastfeeding helps to create a very special bond between you and your baby.  Breastfeeding is convenient. Breast milk is always available at the correct temperature and costs nothing.  Breastfeeding helps to burn calories and helps you lose the weight gained during pregnancy.  Breastfeeding makes your uterus contract to its prepregnancy size faster and slows bleeding (lochia) after you give birth.  Breastfeeding helps to lower your risk of developing type 2 diabetes mellitus, osteoporosis, and breast or ovarian cancer later in life. Signs that your baby is hungry Early Signs of Hunger  Increased alertness or activity.  Stretching.  Movement of the head from side to  side.  Movement of the head and opening of the mouth when the corner of the mouth or cheek is stroked (rooting).  Increased sucking sounds, smacking lips, cooing, sighing, or squeaking.  Hand-to-mouth movements.  Increased sucking of fingers or hands. Late Signs of Hunger  Fussing.  Intermittent crying. Extreme Signs of Hunger  Signs of extreme hunger will require calming and consoling before your baby will be able to breastfeed successfully. Do not wait for the following signs of extreme hunger to occur before you initiate breastfeeding:  Restlessness.  A loud, strong cry.  Screaming. Breastfeeding basics  Breastfeeding Initiation  Find a comfortable place to sit or lie down, with your neck and back well supported.  Place a pillow or rolled up blanket under your baby to bring him or her to the level of  your breast (if you are seated). Nursing pillows are specially designed to help support your arms and your baby while you breastfeed.  Make sure that your baby's abdomen is facing your abdomen.  Gently massage your breast. With your fingertips, massage from your chest wall toward your nipple in a circular motion. This encourages milk flow. You may need to continue this action during the feeding if your milk flows slowly.  Support your breast with 4 fingers underneath and your thumb above your nipple. Make sure your fingers are well away from your nipple and your baby's mouth.  Stroke your baby's lips gently with your finger or nipple.  When your baby's mouth is open wide enough, quickly bring your baby to your breast, placing your entire nipple and as much of the colored area around your nipple (areola) as possible into your baby's mouth.  More areola should be visible above your baby's upper lip than below the lower lip.  Your baby's tongue should be between his or her lower gum and your breast.  Ensure that your baby's mouth is correctly positioned around your nipple  (latched). Your baby's lips should create a seal on your breast and be turned out (everted).  It is common for your baby to suck about 2-3 minutes in order to start the flow of breast milk. Latching  Teaching your baby how to latch on to your breast properly is very important. An improper latch can cause nipple pain and decreased milk supply for you and poor weight gain in your baby. Also, if your baby is not latched onto your nipple properly, he or she may swallow some air during feeding. This can make your baby fussy. Burping your baby when you switch breasts during the feeding can help to get rid of the air. However, teaching your baby to latch on properly is still the best way to prevent fussiness from swallowing air while breastfeeding. Signs that your baby has successfully latched on to your nipple:  Silent tugging or silent sucking, without causing you pain.  Swallowing heard between every 3-4 sucks.  Muscle movement above and in front of his or her ears while sucking. Signs that your baby has not successfully latched on to nipple:  Sucking sounds or smacking sounds from your baby while breastfeeding.  Nipple pain. If you think your baby has not latched on correctly, slip your finger into the corner of your baby's mouth to break the suction and place it between your baby's gums. Attempt breastfeeding initiation again. Signs of Successful Breastfeeding  Signs from your baby:  A gradual decrease in the number of sucks or complete cessation of sucking.  Falling asleep.  Relaxation of his or her body.  Retention of a small amount of milk in his or her mouth.  Letting go of your breast by himself or herself. Signs from you:  Breasts that have increased in firmness, weight, and size 1-3 hours after feeding.  Breasts that are softer immediately after breastfeeding.  Increased milk volume, as well as a change in milk consistency and color by the fifth day of  breastfeeding.  Nipples that are not sore, cracked, or bleeding. Signs That Your Pecola LeisureBaby is Getting Enough Milk  Wetting at least 1-2 diapers during the first 24 hours after birth.  Wetting at least 5-6 diapers every 24 hours for the first week after birth. The urine should be clear or pale yellow by 5 days after birth.  Wetting 6-8 diapers every 24 hours as  your baby continues to grow and develop.  At least 3 stools in a 24-hour period by age 30 days. The stool should be soft and yellow.  At least 3 stools in a 24-hour period by age 88 days. The stool should be seedy and yellow.  No loss of weight greater than 10% of birth weight during the first 27 days of age.  Average weight gain of 4-7 ounces (113-198 g) per week after age 96 days.  Consistent daily weight gain by age 30 days, without weight loss after the age of 2 weeks. After a feeding, your baby may spit up a small amount. This is common. Breastfeeding frequency and duration Frequent feeding will help you make more milk and can prevent sore nipples and breast engorgement. Breastfeed when you feel the need to reduce the fullness of your breasts or when your baby shows signs of hunger. This is called "breastfeeding on demand." Avoid introducing a pacifier to your baby while you are working to establish breastfeeding (the first 4-6 weeks after your baby is born). After this time you may choose to use a pacifier. Research has shown that pacifier use during the first year of a baby's life decreases the risk of sudden infant death syndrome (SIDS). Allow your baby to feed on each breast as long as he or she wants. Breastfeed until your baby is finished feeding. When your baby unlatches or falls asleep while feeding from the first breast, offer the second breast. Because newborns are often sleepy in the first few weeks of life, you may need to awaken your baby to get him or her to feed. Breastfeeding times will vary from baby to baby. However, the  following rules can serve as a guide to help you ensure that your baby is properly fed:  Newborns (babies 62 weeks of age or younger) may breastfeed every 1-3 hours.  Newborns should not go longer than 3 hours during the day or 5 hours during the night without breastfeeding.  You should breastfeed your baby a minimum of 8 times in a 24-hour period until you begin to introduce solid foods to your baby at around 5 months of age. Breast milk pumping Pumping and storing breast milk allows you to ensure that your baby is exclusively fed your breast milk, even at times when you are unable to breastfeed. This is especially important if you are going back to work while you are still breastfeeding or when you are not able to be present during feedings. Your lactation consultant can give you guidelines on how long it is safe to store breast milk. A breast pump is a machine that allows you to pump milk from your breast into a sterile bottle. The pumped breast milk can then be stored in a refrigerator or freezer. Some breast pumps are operated by hand, while others use electricity. Ask your lactation consultant which type will work best for you. Breast pumps can be purchased, but some hospitals and breastfeeding support groups lease breast pumps on a monthly basis. A lactation consultant can teach you how to hand express breast milk, if you prefer not to use a pump. Caring for your breasts while you breastfeed Nipples can become dry, cracked, and sore while breastfeeding. The following recommendations can help keep your breasts moisturized and healthy:  Avoid using soap on your nipples.  Wear a supportive bra. Although not required, special nursing bras and tank tops are designed to allow access to your breasts for breastfeeding without taking  off your entire bra or top. Avoid wearing underwire-style bras or extremely tight bras.  Air dry your nipples for 3-404minutes after each feeding.  Use only cotton bra  pads to absorb leaked breast milk. Leaking of breast milk between feedings is normal.  Use lanolin on your nipples after breastfeeding. Lanolin helps to maintain your skin's normal moisture barrier. If you use pure lanolin, you do not need to wash it off before feeding your baby again. Pure lanolin is not toxic to your baby. You may also hand express a few drops of breast milk and gently massage that milk into your nipples and allow the milk to air dry. In the first few weeks after giving birth, some women experience extremely full breasts (engorgement). Engorgement can make your breasts feel heavy, warm, and tender to the touch. Engorgement peaks within 3-5 days after you give birth. The following recommendations can help ease engorgement:  Completely empty your breasts while breastfeeding or pumping. You may want to start by applying warm, moist heat (in the shower or with warm water-soaked hand towels) just before feeding or pumping. This increases circulation and helps the milk flow. If your baby does not completely empty your breasts while breastfeeding, pump any extra milk after he or she is finished.  Wear a snug bra (nursing or regular) or tank top for 1-2 days to signal your body to slightly decrease milk production.  Apply ice packs to your breasts, unless this is too uncomfortable for you.  Make sure that your baby is latched on and positioned properly while breastfeeding. If engorgement persists after 48 hours of following these recommendations, contact your health care provider or a Advertising copywriterlactation consultant. Overall health care recommendations while breastfeeding  Eat healthy foods. Alternate between meals and snacks, eating 3 of each per day. Because what you eat affects your breast milk, some of the foods may make your baby more irritable than usual. Avoid eating these foods if you are sure that they are negatively affecting your baby.  Drink milk, fruit juice, and water to satisfy your  thirst (about 10 glasses a day).  Rest often, relax, and continue to take your prenatal vitamins to prevent fatigue, stress, and anemia.  Continue breast self-awareness checks.  Avoid chewing and smoking tobacco. Chemicals from cigarettes that pass into breast milk and exposure to secondhand smoke may harm your baby.  Avoid alcohol and drug use, including marijuana. Some medicines that may be harmful to your baby can pass through breast milk. It is important to ask your health care provider before taking any medicine, including all over-the-counter and prescription medicine as well as vitamin and herbal supplements. It is possible to become pregnant while breastfeeding. If birth control is desired, ask your health care provider about options that will be safe for your baby. Contact a health care provider if:  You feel like you want to stop breastfeeding or have become frustrated with breastfeeding.  You have painful breasts or nipples.  Your nipples are cracked or bleeding.  Your breasts are red, tender, or warm.  You have a swollen area on either breast.  You have a fever or chills.  You have nausea or vomiting.  You have drainage other than breast milk from your nipples.  Your breasts do not become full before feedings by the fifth day after you give birth.  You feel sad and depressed.  Your baby is too sleepy to eat well.  Your baby is having trouble sleeping.  Your baby  is wetting less than 3 diapers in a 24-hour period.  Your baby has less than 3 stools in a 24-hour period.  Your baby's skin or the white part of his or her eyes becomes yellow.  Your baby is not gaining weight by 92 days of age. Get help right away if:  Your baby is overly tired (lethargic) and does not want to wake up and feed.  Your baby develops an unexplained fever. This information is not intended to replace advice given to you by your health care provider. Make sure you discuss any questions  you have with your health care provider. Document Released: 08/19/2005 Document Revised: 01/31/2016 Document Reviewed: 02/10/2013 Elsevier Interactive Patient Education  2017 [employer].

## 2016-08-16 NOTE — Progress Notes (Signed)
Here for initial prenatal visit. Used interpreter [employer].  States not taking labetolol because when she went to get it , her medicaid was;t active. It is now, so will resend labetolol. Patient c/o lower back pain at work when standing a lot. Encouraged her to get a maternity support belt.

## 2016-08-17 LAB — PROTEIN / CREATININE RATIO, URINE
CREATININE, URINE: 172 mg/dL (ref 20–320)
Protein Creatinine Ratio: 87 mg/g creat (ref 21–161)
Total Protein, Urine: 15 mg/dL (ref 5–24)

## 2016-08-17 LAB — CULTURE, OB URINE: ORGANISM ID, BACTERIA: NO GROWTH

## 2016-08-18 LAB — PAIN MGMT, PROFILE 6 CONF W/O MM, U
6 ACETYLMORPHINE: NEGATIVE ng/mL (ref <10)
ALCOHOL METABOLITES: NEGATIVE ng/mL (ref <500)
Amphetamines: NEGATIVE ng/mL (ref <500)
BENZODIAZEPINES: NEGATIVE ng/mL (ref <100)
Barbiturates: NEGATIVE ng/mL (ref <300)
COCAINE METABOLITE: NEGATIVE ng/mL (ref <150)
Creatinine: 148.9 mg/dL (ref >=20.0)
MARIJUANA METABOLITE: NEGATIVE ng/mL (ref <20)
Methadone Metabolite: NEGATIVE ng/mL (ref <100)
OXYCODONE: NEGATIVE ng/mL (ref <100)
Opiates: NEGATIVE ng/mL (ref <100)
Oxidant: NEGATIVE ug/mL (ref <200)
PHENCYCLIDINE: NEGATIVE ng/mL (ref <25)
PLEASE NOTE: 0
pH: 7.43 (ref 4.5–9.0)

## 2016-08-19 LAB — PRENATAL PROFILE (SOLSTAS)
ANTIBODY SCREEN: NEGATIVE
BASOS ABS: 0 {cells}/uL (ref 0–200)
Basophils Relative: 0 %
EOS PCT: 3 %
Eosinophils Absolute: 171 cells/uL (ref 15–500)
HCT: 36.8 % (ref 35.0–45.0)
HEMOGLOBIN: 12.4 g/dL (ref 11.7–15.5)
HEP B S AG: NEGATIVE
HIV 1&2 Ab, 4th Generation: NONREACTIVE
Lymphocytes Relative: 19 %
Lymphs Abs: 1083 cells/uL (ref 850–3900)
MCH: 29.8 pg (ref 27.0–33.0)
MCHC: 33.7 g/dL (ref 32.0–36.0)
MCV: 88.5 fL (ref 80.0–100.0)
MONOS PCT: 8 %
MPV: 11.3 fL (ref 7.5–12.5)
Monocytes Absolute: 456 cells/uL (ref 200–950)
Neutro Abs: 3990 cells/uL (ref 1500–7800)
Neutrophils Relative %: 70 %
Platelets: 147 10*3/uL (ref 140–400)
RBC: 4.16 MIL/uL (ref 3.80–5.10)
RDW: 15 % (ref 11.0–15.0)
RUBELLA: 2.91 {index} — AB (ref <0.90)
Rh Type: POSITIVE
WBC: 5.7 10*3/uL (ref 3.8–10.8)

## 2016-08-20 LAB — HEMOGLOBINOPATHY EVALUATION
HCT: 36.8 % (ref 35.0–45.0)
HGB A2 QUANT: 2.4 % (ref 1.8–3.5)
Hemoglobin: 12.4 g/dL (ref 11.7–15.5)
Hgb A: 96.6 % (ref >96.0)
MCH: 29.8 pg (ref 27.0–33.0)
MCV: 88.5 fL (ref 80.0–100.0)
RBC: 4.16 MIL/uL (ref 3.80–5.10)
RDW: 15 % (ref 11.0–15.0)

## 2016-08-20 LAB — CYTOLOGY - PAP
Diagnosis: NEGATIVE
HPV: NOT DETECTED

## 2016-08-28 ENCOUNTER — Ambulatory Visit (HOSPITAL_COMMUNITY): Admission: RE | Admit: 2016-08-28 | Payer: Medicaid Other | Source: Ambulatory Visit

## 2016-09-02 NOTE — L&D Delivery Note (Addendum)
Delivery Note At 7:11 AM a viable female was delivered via Vaginal, Spontaneous Delivery (Presentation: vertex ; OA  ).  APGAR: 8, 9; weight 4 lb 9 oz (2070 g).   Placenta status: delivered with gentle traction .  Cord:  3 vessle with the following complications: none.  Cord pH: not collected  Anesthesia:  epidrual Episiotomy: None Lacerations: None Est. Blood Loss (mL): 200  Mom to postpartum.  Baby to NICU.  Ernestina Penna 11/30/2016, 8:07 AM

## 2016-09-04 ENCOUNTER — Encounter (HOSPITAL_COMMUNITY): Payer: Self-pay

## 2016-09-04 ENCOUNTER — Ambulatory Visit (HOSPITAL_COMMUNITY)
Admission: RE | Admit: 2016-09-04 | Discharge: 2016-09-04 | Disposition: A | Discharge status: Home / Self Care | Payer: Medicaid Other | Source: Ambulatory Visit | Attending: Family Medicine | Admitting: Family Medicine

## 2016-09-04 ENCOUNTER — Ambulatory Visit (HOSPITAL_COMMUNITY)
Admission: RE | Admit: 2016-09-04 | Discharge: 2016-09-04 | Disposition: A | Discharge status: Home / Self Care | Payer: Medicaid Other | Source: Ambulatory Visit | Attending: Certified Nurse Midwife | Admitting: Certified Nurse Midwife

## 2016-09-04 ENCOUNTER — Other Ambulatory Visit: Payer: Self-pay | Admitting: Family Medicine

## 2016-09-04 DIAGNOSIS — O0990 Supervision of high risk pregnancy, unspecified, unspecified trimester: Secondary | ICD-10-CM

## 2016-09-04 DIAGNOSIS — Z369 Encounter for antenatal screening, unspecified: Secondary | ICD-10-CM

## 2016-09-04 DIAGNOSIS — Z3A22 22 weeks gestation of pregnancy: Secondary | ICD-10-CM | POA: Insufficient documentation

## 2016-09-04 DIAGNOSIS — O09522 Supervision of elderly multigravida, second trimester: Secondary | ICD-10-CM

## 2016-09-04 DIAGNOSIS — O10012 Pre-existing essential hypertension complicating pregnancy, second trimester: Secondary | ICD-10-CM | POA: Insufficient documentation

## 2016-09-04 DIAGNOSIS — O0942 Supervision of pregnancy with grand multiparity, second trimester: Secondary | ICD-10-CM | POA: Diagnosis not present

## 2016-09-04 DIAGNOSIS — O10919 Unspecified pre-existing hypertension complicating pregnancy, unspecified trimester: Secondary | ICD-10-CM

## 2016-09-04 DIAGNOSIS — Z363 Encounter for antenatal screening for malformations: Secondary | ICD-10-CM | POA: Diagnosis not present

## 2016-09-04 NOTE — Progress Notes (Signed)
Appointment Date:  DOB: [birth year] Referring Provider: Roe Coombs, CNM Attending:  Mrs. Peg Lalanne and her husband, Malva Cogan, were seen for genetic counseling because of a maternal age of 41 y.o..   Language resources provided Swahili/English translation.  In summary:  Discussed AMA and associated risk for fetal aneuploidy  Discussed options for screening  NIPS  Ultrasound  Discussed diagnostic testing options  Amniocentesis  Reviewed family history concerns - none reported  Discussed carrier screening options - declined  CF  SMA  Hemoglobinopathies - normal  They were counseled regarding maternal age and the association with risk for chromosome conditions due to nondisjunction with aging of the ova.   We reviewed chromosomes, nondisjunction, and the associated 1 in 17 risk for fetal aneuploidy related to a maternal age of 41 y.o. at [redacted]w[redacted]d gestation.  They were counseled that the risk for aneuploidy decreases as gestational age increases, accounting for those pregnancies which spontaneously abort.  We specifically discussed Down syndrome (trisomy 64), trisomies 55 and 54, and sex chromosome aneuploidies (47,XXX and 47,XXY) including the common features and prognoses of each.   We reviewed available screening options including noninvasive prenatal screening (NIPS)/cell free DNA (cfDNA) screening and detailed ultrasound.  They were counseled that screening tests are used to modify a patient's a priori risk for aneuploidy, typically based on age. This estimate provides a pregnancy specific risk assessment. We reviewed the benefits and limitations of each option. Specifically, we discussed the conditions for which each test screens, the detection rates, and false positive rates of each. They were also counseled regarding diagnostic testing via amniocentesis. We reviewed the approximate 1 in 300-500 risk for complications from amniocentesis, including spontaneous pregnancy  loss. We discussed the possible results that the tests might provide including: positive, negative, unanticipated, and no result.   A complete ultrasound was performed today. The ultrasound report will be sent under separate cover. There were no visualized fetal anomalies or markers suggestive of aneuploidy. Diagnostic testing was declined today.  They understand that screening tests cannot rule out all birth defects or genetic syndromes. The patient was advised of this limitation and states she does not want additional testing or screening at this time.   Mrs. Bennetts was provided with written information regarding cystic fibrosis (CF), spinal muscular atrophy (SMA) and hemoglobinopathies including the carrier frequency, availability of carrier screening and prenatal diagnosis if indicated.  In addition, we discussed that CF and hemoglobinopathies are routinely screened for as part of the [place] newborn screening panel.  After further discussion, she declined screening for CF and SMA, previous screening for hemoglobinopathies was reported to be normal.  Both family histories were reviewed and found to be noncontributory for birth defects, intellectual disability, and known genetic conditions. Their youngest son was diagnosed in [place] as having Sickle cell anemia, but was tested three times in the [place] and does not have it.  We reviewed autosomal recessive inheritance and that Mrs. Orzel's hemoglobin electrophoresis indicated that she is not a carrier for sickle cell anemia, thus, we would not expect her children to be at an increased risk for this condition. Without further information regarding the provided family history, an accurate genetic risk cannot be calculated. Further genetic counseling is warranted if more information is obtained.  Mrs. Strehl denied exposure to environmental toxins or chemical agents. She denied the use of alcohol, tobacco or street drugs. She denied significant viral  illnesses during the course of her pregnancy. Her medical and surgical histories were  noncontributory.   I counseled this couple regarding the above risks and available options.  The approximate face-to-face time with the genetic counselor was 60 minutes.  Mady Gemmaaragh Nayellie Sanseverino, MS,  Certified Genetic Counselor

## 2016-09-05 ENCOUNTER — Other Ambulatory Visit (HOSPITAL_COMMUNITY): Payer: Self-pay | Admitting: *Deleted

## 2016-09-05 DIAGNOSIS — O09529 Supervision of elderly multigravida, unspecified trimester: Secondary | ICD-10-CM

## 2016-09-13 ENCOUNTER — Encounter: Payer: Medicaid Other | Admitting: Obstetrics & Gynecology

## 2016-09-25 ENCOUNTER — Encounter: Payer: Medicaid Other | Admitting: Obstetrics and Gynecology

## 2016-10-09 ENCOUNTER — Encounter: Payer: Medicaid Other | Admitting: Obstetrics and Gynecology

## 2016-10-16 ENCOUNTER — Encounter (HOSPITAL_COMMUNITY): Payer: Self-pay

## 2016-10-16 ENCOUNTER — Ambulatory Visit (HOSPITAL_COMMUNITY)
Admission: RE | Admit: 2016-10-16 | Discharge: 2016-10-16 | Disposition: A | Discharge status: Home / Self Care | Payer: Medicaid Other | Source: Ambulatory Visit | Attending: Family Medicine | Admitting: Family Medicine

## 2016-10-16 DIAGNOSIS — Z363 Encounter for antenatal screening for malformations: Secondary | ICD-10-CM | POA: Diagnosis not present

## 2016-10-16 DIAGNOSIS — O0943 Supervision of pregnancy with grand multiparity, third trimester: Secondary | ICD-10-CM | POA: Diagnosis not present

## 2016-10-16 DIAGNOSIS — O09529 Supervision of elderly multigravida, unspecified trimester: Secondary | ICD-10-CM

## 2016-10-16 DIAGNOSIS — Z3A28 28 weeks gestation of pregnancy: Secondary | ICD-10-CM | POA: Diagnosis not present

## 2016-10-16 DIAGNOSIS — O10013 Pre-existing essential hypertension complicating pregnancy, third trimester: Secondary | ICD-10-CM | POA: Diagnosis not present

## 2016-10-16 DIAGNOSIS — O09513 Supervision of elderly primigravida, third trimester: Secondary | ICD-10-CM | POA: Diagnosis not present

## 2016-10-17 ENCOUNTER — Other Ambulatory Visit (HOSPITAL_COMMUNITY): Payer: Self-pay | Admitting: *Deleted

## 2016-10-17 DIAGNOSIS — O10919 Unspecified pre-existing hypertension complicating pregnancy, unspecified trimester: Secondary | ICD-10-CM

## 2016-11-01 ENCOUNTER — Encounter: Payer: Medicaid Other | Admitting: Obstetrics and Gynecology

## 2016-11-13 ENCOUNTER — Inpatient Hospital Stay (HOSPITAL_COMMUNITY)
Admission: AD | Admit: 2016-11-13 | Discharge: 2016-11-13 | Disposition: A | Discharge status: Home / Self Care | Payer: BLUE CROSS/BLUE SHIELD | Source: Ambulatory Visit | Attending: Obstetrics and Gynecology | Admitting: Obstetrics and Gynecology

## 2016-11-13 ENCOUNTER — Encounter (HOSPITAL_COMMUNITY): Payer: Self-pay | Admitting: *Deleted

## 2016-11-13 ENCOUNTER — Encounter (HOSPITAL_COMMUNITY): Payer: Self-pay

## 2016-11-13 ENCOUNTER — Ambulatory Visit (HOSPITAL_COMMUNITY)
Admission: RE | Admit: 2016-11-13 | Discharge: 2016-11-13 | Disposition: A | Discharge status: Home / Self Care | Payer: Medicaid Other | Source: Ambulatory Visit | Attending: Family Medicine | Admitting: Family Medicine

## 2016-11-13 DIAGNOSIS — O0993 Supervision of high risk pregnancy, unspecified, third trimester: Secondary | ICD-10-CM

## 2016-11-13 DIAGNOSIS — Z79899 Other long term (current) drug therapy: Secondary | ICD-10-CM | POA: Diagnosis not present

## 2016-11-13 DIAGNOSIS — O10913 Unspecified pre-existing hypertension complicating pregnancy, third trimester: Secondary | ICD-10-CM | POA: Insufficient documentation

## 2016-11-13 DIAGNOSIS — Z3A32 32 weeks gestation of pregnancy: Secondary | ICD-10-CM | POA: Diagnosis not present

## 2016-11-13 DIAGNOSIS — O09522 Supervision of elderly multigravida, second trimester: Secondary | ICD-10-CM

## 2016-11-13 DIAGNOSIS — Z641 Problems related to multiparity: Secondary | ICD-10-CM

## 2016-11-13 DIAGNOSIS — O10919 Unspecified pre-existing hypertension complicating pregnancy, unspecified trimester: Secondary | ICD-10-CM | POA: Diagnosis present

## 2016-11-13 DIAGNOSIS — O0990 Supervision of high risk pregnancy, unspecified, unspecified trimester: Secondary | ICD-10-CM

## 2016-11-13 LAB — CBC WITH DIFFERENTIAL/PLATELET
BASOS ABS: 0 10*3/uL (ref 0.0–0.1)
BASOS PCT: 0 %
EOS PCT: 2 %
Eosinophils Absolute: 0.2 10*3/uL (ref 0.0–0.7)
HCT: 35 % — ABNORMAL LOW (ref 36.0–46.0)
Hemoglobin: 12.1 g/dL (ref 12.0–15.0)
LYMPHS PCT: 22 %
Lymphs Abs: 1.7 10*3/uL (ref 0.7–4.0)
MCH: 28.9 pg (ref 26.0–34.0)
MCHC: 34.6 g/dL (ref 30.0–36.0)
MCV: 83.5 fL (ref 78.0–100.0)
Monocytes Absolute: 0.5 10*3/uL (ref 0.1–1.0)
Monocytes Relative: 7 %
NEUTROS ABS: 5.2 10*3/uL (ref 1.7–7.7)
Neutrophils Relative %: 69 %
PLATELETS: 135 10*3/uL — AB (ref 150–400)
RBC: 4.19 MIL/uL (ref 3.87–5.11)
RDW: 12.7 % (ref 11.5–15.5)
WBC: 7.6 10*3/uL (ref 4.0–10.5)

## 2016-11-13 LAB — COMPREHENSIVE METABOLIC PANEL
ALBUMIN: 3.1 g/dL — AB (ref 3.5–5.0)
ALT: 12 U/L — ABNORMAL LOW (ref 14–54)
AST: 23 U/L (ref 15–41)
Alkaline Phosphatase: 69 U/L (ref 38–126)
Anion gap: 4 — ABNORMAL LOW (ref 5–15)
BUN: 5 mg/dL — ABNORMAL LOW (ref 6–20)
CO2: 25 mmol/L (ref 22–32)
Calcium: 8.3 mg/dL — ABNORMAL LOW (ref 8.9–10.3)
Chloride: 106 mmol/L (ref 101–111)
Creatinine, Ser: 0.36 mg/dL — ABNORMAL LOW (ref 0.44–1.00)
GFR calc Af Amer: >60 mL/min (ref >60)
GFR calc non Af Amer: >60 mL/min (ref >60)
GLUCOSE: 82 mg/dL (ref 65–99)
POTASSIUM: 3.6 mmol/L (ref 3.5–5.1)
SODIUM: 135 mmol/L (ref 135–145)
TOTAL PROTEIN: 6.3 g/dL — AB (ref 6.5–8.1)
Total Bilirubin: 0.6 mg/dL (ref 0.3–1.2)

## 2016-11-13 LAB — PROTEIN / CREATININE RATIO, URINE
Creatinine, Urine: 164 mg/dL
PROTEIN CREATININE RATIO: 0.1 mg/mg{creat} (ref 0.00–0.15)
TOTAL PROTEIN, URINE: 16 mg/dL

## 2016-11-13 MED ORDER — LABETALOL HCL 5 MG/ML IV SOLN
20.0000 mg | INTRAVENOUS | Status: DC | PRN
  Administered 2016-11-13: 20 mg via INTRAVENOUS
  Filled 2016-11-13: qty 8

## 2016-11-13 MED ORDER — LABETALOL HCL 100 MG PO TABS: 200.0000 mg | ORAL_TABLET | Freq: Two times a day (BID) | ORAL | 2 refills | Status: DC

## 2016-11-13 MED ORDER — HYDRALAZINE HCL 20 MG/ML IJ SOLN: 10.0000 mg | Freq: Once | INTRAMUSCULAR | Status: DC | PRN

## 2016-11-13 MED ORDER — LABETALOL HCL 5 MG/ML IV SOLN
INTRAVENOUS | Status: AC
  Administered 2016-11-13: 40 mg
  Filled 2016-11-13: qty 4

## 2016-11-13 MED ORDER — LACTATED RINGERS IV SOLN: INTRAVENOUS | Status: DC

## 2016-11-13 NOTE — Discharge Instructions (Signed)
Hypertension During Pregnancy Hypertension is also called high blood pressure. High blood pressure means that the force of your blood moving in your body is too strong. When you are pregnant, this condition should be watched carefully. It can cause problems for you and your baby. Follow these instructions at home: Eating and drinking  Drink enough fluid to keep your pee (urine) clear or pale yellow.  Eat healthy foods that are low in salt (sodium). ? Do not add salt to your food. ? Check labels on foods and drinks to see much salt is in them. Look on the label where you see "Sodium." Lifestyle  Do not use any products that contain nicotine or tobacco, such as cigarettes and e-cigarettes. If you need help quitting, ask your doctor.  Do not use alcohol.  Avoid caffeine.  Avoid stress. Rest and get plenty of sleep. General instructions  Take over-the-counter and prescription medicines only as told by your doctor.  While lying down, lie on your left side. This keeps pressure off your baby.  While sitting or lying down, raise (elevate) your feet. Try putting some pillows under your lower legs.  Exercise regularly. Ask your doctor what kinds of exercise are best for you.  Keep all prenatal and follow-up visits as told by your doctor. This is important. Contact a doctor if:  You have symptoms that your doctor told you to watch for, such as: ? Fever. ? Throwing up (vomiting). ? Headache. Get help right away if:  You have very bad pain in your belly (abdomen).  You are throwing up, and this does not get better with treatment.  You suddenly get swelling in your hands, ankles, or face.  You gain 4 lb (1.8 kg) or more in 1 week.  You get bleeding from your vagina.  You have blood in your pee.  You do not feel your baby moving as much as normal.  You have a change in vision.  You have muscle twitching or sudden tightening (spasms).  You have trouble breathing.  Your lips  or fingernails turn blue. This information is not intended to replace advice given to you by your health care provider. Make sure you discuss any questions you have with your health care provider. Document Released: 09/21/2010 Document Revised: 04/30/2016 Document Reviewed: 04/30/2016 Elsevier Interactive Patient Education  2017 [employer]  

## 2016-11-13 NOTE — MAU Provider Note (Signed)
History   Monique Moon is a 41yo female [phone] who present to MAU at 1784w1d referred from MFM for high blood pressures. She has hypertension diagnosed prior to this pregnancy, for which she used to take hctz, replaced by labetalol 100 BID during pregnancy. She reports she hasn't taken her labetalol this morning yet. Denies headache, blurred vision, N/V, fevers, leg swelling, RUQ pain, dizzines or vision changes. Reports good fetal movement. Denies vaginal bledding, fluid leakage or contractions.   BP (!) 170/101 (BP Location: Right Arm)   Pulse 83   Temp 98.2 F (36.8 C) (Oral)   Resp 18   LMP 04/02/2016   Interview made with the help of her Swahili certified interpreter.  CSN: [id number]  Arrival date and time: 11/13/16 1053   First Provider Initiated Contact with Patient 11/13/16 1117      Chief Complaint  Patient presents with  . Hypertension   HPI  OB History    Gravida Para Term Preterm AB Living   9 8 8     8    SAB TAB Ectopic Multiple Live Births           8      Past Medical History:  Diagnosis Date  . HTN (hypertension)   . Malaria     Past Surgical History:  Procedure Laterality Date  . NO PAST SURGERIES      History reviewed. No pertinent family history.  Social History  Substance Use Topics  . Smoking status: Never Smoker  . Smokeless tobacco: Never Used  . Alcohol use No    Allergies: No Known Allergies  Prescriptions Prior to Admission  Medication Sig Dispense Refill Last Dose  . aspirin EC 81 MG tablet Take 1 tablet (81 mg total) by mouth daily. (Patient not taking: Reported on 10/16/2016) 90 tablet 1 Not Taking  . labetalol (NORMODYNE) 100 MG tablet Take 1 tablet (100 mg total) by mouth 2 (two) times daily. 60 tablet 2 Taking  . Prenatal Vit-Fe Fumarate-FA (PREPLUS) 27-1 MG TABS Take 1 tablet by mouth daily. 30 tablet 13 Taking    Review of Systems  Constitutional: Negative for fatigue and fever.  HENT: Negative for rhinorrhea.   Eyes:  Negative for visual disturbance.  Respiratory: Negative for apnea, cough and shortness of breath.   Cardiovascular: Negative for chest pain, palpitations and leg swelling.  Gastrointestinal: Negative for abdominal pain, blood in stool, diarrhea, nausea and vomiting.  Genitourinary: Negative for dysuria, hematuria, pelvic pain and vaginal bleeding.  Musculoskeletal: Negative for myalgias.  Skin: Negative for pallor and rash.  Neurological: Negative for dizziness and headaches.   Physical Exam   Blood pressure (!) 170/101, pulse 83, temperature 98.2 F (36.8 C), temperature source Oral, resp. rate 18, last menstrual period 04/02/2016.  Physical Exam  Constitutional: She is oriented to person, place, and time. She appears well-developed and well-nourished.  HENT:  Head: Normocephalic and atraumatic.  Eyes: Conjunctivae and EOM are normal. Pupils are equal, round, and reactive to light.  Cardiovascular: Normal rate, regular rhythm, normal heart sounds and intact distal pulses.  Exam reveals no gallop and no friction rub.   No murmur heard. Respiratory: Effort normal and breath sounds normal. No respiratory distress. She has no wheezes. She has no rales.  GI: Soft. She exhibits no distension. There is no tenderness. There is no rebound and no guarding.  Neurological: She is alert and oriented to person, place, and time. She has normal reflexes.  Skin: Skin is warm and dry.  No rash noted. No pallor.    MAU Course  Procedures  MDM CBC, CMP, P/C Start IV Labetalol IV Monitor patient and fetus  Assessment and Plan  Monique Moon is a 41yo female [id number] who present to MAU at [redacted]w[redacted]d referred from MFM for high blood pressures. BP were within severe ranges but patient remained assyntomatic. CMP, CBC, P/C performed and resulted wnl. Her BP was controlled with IV labetalol.   Increase labetalol dose to 200mg  BID.  We spoke with patient how important it is to follow her prenatal care  regularly. Message sent to clinic.  Follow-up Information    [facility].   Specialty:  Obstetrics and Gynecology Why:  They will call you with an appointment. You need to be seen twice per week now  Contact information: [address] [place] [address] [phone]          Nehemiah Settle do Silvio Clayman 11/13/2016, 11:51 AM   CNM attestation:  I have seen and examined this patient; I agree with above documentation in the resident's note.   Monique Moon is a 41 y.o. [id number] at [redacted]w[redacted]d sent from MFM with elevated blood pressure.  +FM, denies LOF, VB, contractions, vaginal discharge. Patient is on labetalol 100mg  BID. She reports that she did not take her morning dose prior to coming to her MFM appointment. She reports that she has been taking her medication as prescribed other than today's missed dose.   PE: BP 141/76   Pulse 79   Temp 98.2 F (36.8 C) (Oral)   Resp 20   LMP 04/02/2016  Gen: calm comfortable, NAD Resp: normal effort, no distress Abd: gravid Reflexes normal, no clonus.   ROS, labs, PMH reviewed NST reactive  1306: C/W Dr. Jolayne Panther. Patient's b/p 140/90 after IV labetalol. Labs normal. No symptoms. Will increase labetalol to 200mg  BID and patient needs to be seen in the office for twice weekly testing starting this week.  Discussed plan with the patient and interpreter. She verbalizes understanding.   Plan: - fetal kick counts reinforced, preterm labor precautions - pre-eclampsia warning signs  - labetalol 200mg  BID, printed RX given  - clinic will call this week for appointment  Tawnya Crook, CNM 1:52 PM

## 2016-11-20 ENCOUNTER — Telehealth: Payer: Self-pay | Admitting: Family Medicine

## 2016-11-20 NOTE — Telephone Encounter (Signed)
Called patient due to not being seen since December. Left voicemail for patient to return our call asap. Also called friend's number on file no voicemail is setup.

## 2016-11-21 ENCOUNTER — Other Ambulatory Visit: Payer: BLUE CROSS/BLUE SHIELD | Admitting: Obstetrics and Gynecology

## 2016-11-22 ENCOUNTER — Telehealth: Payer: Self-pay | Admitting: Obstetrics & Gynecology

## 2016-11-22 NOTE — Telephone Encounter (Signed)
Called patient with Interpreter to get her rescheduled. Patient was not home, but a young man answered the phone. He said he would give her the message of needing to call us.

## 2016-11-26 NOTE — Telephone Encounter (Signed)
Called patient with [employer]. Spoke with husband, and made an appointment for her to come in on 11/27/16 for NST only. Husband stated he would have her here for her appointment.

## 2016-11-27 ENCOUNTER — Ambulatory Visit (INDEPENDENT_AMBULATORY_CARE_PROVIDER_SITE_OTHER): Payer: BLUE CROSS/BLUE SHIELD | Admitting: Family Medicine

## 2016-11-27 ENCOUNTER — Encounter: Payer: Self-pay | Admitting: *Deleted

## 2016-11-27 ENCOUNTER — Encounter (HOSPITAL_COMMUNITY): Payer: Self-pay | Admitting: *Deleted

## 2016-11-27 ENCOUNTER — Inpatient Hospital Stay (HOSPITAL_COMMUNITY)
Admission: AD | Admit: 2016-11-27 | Discharge: 2016-12-04 | DRG: 774 | Disposition: A | Discharge status: Home / Self Care | Payer: BLUE CROSS/BLUE SHIELD | Source: Ambulatory Visit | Attending: Obstetrics and Gynecology | Admitting: Obstetrics and Gynecology

## 2016-11-27 VITALS — BP 169/96 | HR 84 | Wt 193.1 lb

## 2016-11-27 DIAGNOSIS — Z641 Problems related to multiparity: Secondary | ICD-10-CM

## 2016-11-27 DIAGNOSIS — O0993 Supervision of high risk pregnancy, unspecified, third trimester: Secondary | ICD-10-CM | POA: Diagnosis not present

## 2016-11-27 DIAGNOSIS — O0990 Supervision of high risk pregnancy, unspecified, unspecified trimester: Secondary | ICD-10-CM

## 2016-11-27 DIAGNOSIS — O10913 Unspecified pre-existing hypertension complicating pregnancy, third trimester: Secondary | ICD-10-CM | POA: Diagnosis not present

## 2016-11-27 DIAGNOSIS — O1002 Pre-existing essential hypertension complicating childbirth: Secondary | ICD-10-CM | POA: Diagnosis present

## 2016-11-27 DIAGNOSIS — Z8759 Personal history of other complications of pregnancy, childbirth and the puerperium: Secondary | ICD-10-CM | POA: Diagnosis present

## 2016-11-27 DIAGNOSIS — O119 Pre-existing hypertension with pre-eclampsia, unspecified trimester: Secondary | ICD-10-CM

## 2016-11-27 DIAGNOSIS — O114 Pre-existing hypertension with pre-eclampsia, complicating childbirth: Principal | ICD-10-CM | POA: Diagnosis present

## 2016-11-27 DIAGNOSIS — O10013 Pre-existing essential hypertension complicating pregnancy, third trimester: Secondary | ICD-10-CM | POA: Diagnosis not present

## 2016-11-27 DIAGNOSIS — O10919 Unspecified pre-existing hypertension complicating pregnancy, unspecified trimester: Secondary | ICD-10-CM

## 2016-11-27 DIAGNOSIS — Z3A34 34 weeks gestation of pregnancy: Secondary | ICD-10-CM

## 2016-11-27 DIAGNOSIS — O99214 Obesity complicating childbirth: Secondary | ICD-10-CM | POA: Diagnosis present

## 2016-11-27 DIAGNOSIS — Z789 Other specified health status: Secondary | ICD-10-CM

## 2016-11-27 DIAGNOSIS — O09522 Supervision of elderly multigravida, second trimester: Secondary | ICD-10-CM

## 2016-11-27 DIAGNOSIS — E669 Obesity, unspecified: Secondary | ICD-10-CM | POA: Diagnosis present

## 2016-11-27 DIAGNOSIS — Z6832 Body mass index (BMI) 32.0-32.9, adult: Secondary | ICD-10-CM

## 2016-11-27 DIAGNOSIS — O10019 Pre-existing essential hypertension complicating pregnancy, unspecified trimester: Secondary | ICD-10-CM

## 2016-11-27 LAB — CBC WITH DIFFERENTIAL/PLATELET
Basophils Absolute: 0 10*3/uL (ref 0.0–0.1)
Basophils Relative: 0 %
Eosinophils Absolute: 0.1 10*3/uL (ref 0.0–0.7)
Eosinophils Relative: 2 %
HEMATOCRIT: 32.9 % — AB (ref 36.0–46.0)
HEMOGLOBIN: 11.4 g/dL — AB (ref 12.0–15.0)
LYMPHS ABS: 1.5 10*3/uL (ref 0.7–4.0)
LYMPHS PCT: 22 %
MCH: 28.7 pg (ref 26.0–34.0)
MCHC: 34.7 g/dL (ref 30.0–36.0)
MCV: 82.9 fL (ref 78.0–100.0)
MONOS PCT: 6 %
Monocytes Absolute: 0.4 10*3/uL (ref 0.1–1.0)
NEUTROS ABS: 4.7 10*3/uL (ref 1.7–7.7)
NEUTROS PCT: 70 %
Platelets: 131 10*3/uL — ABNORMAL LOW (ref 150–400)
RBC: 3.97 MIL/uL (ref 3.87–5.11)
RDW: 13.3 % (ref 11.5–15.5)
WBC: 6.7 10*3/uL (ref 4.0–10.5)

## 2016-11-27 LAB — TYPE AND SCREEN
ABO/RH(D): B POS
Antibody Screen: NEGATIVE

## 2016-11-27 LAB — PROTEIN / CREATININE RATIO, URINE
Creatinine, Urine: 171 mg/dL
PROTEIN CREATININE RATIO: 0.12 mg/mg{creat} (ref 0.00–0.15)
TOTAL PROTEIN, URINE: 20 mg/dL

## 2016-11-27 LAB — COMPREHENSIVE METABOLIC PANEL
ALK PHOS: 68 U/L (ref 38–126)
ALT: 16 U/L (ref 14–54)
ANION GAP: 7 (ref 5–15)
AST: 20 U/L (ref 15–41)
Albumin: 2.7 g/dL — ABNORMAL LOW (ref 3.5–5.0)
BILIRUBIN TOTAL: 0.6 mg/dL (ref 0.3–1.2)
BUN: <5 mg/dL — ABNORMAL LOW (ref 6–20)
CALCIUM: 7.8 mg/dL — AB (ref 8.9–10.3)
CO2: 22 mmol/L (ref 22–32)
Chloride: 104 mmol/L (ref 101–111)
Creatinine, Ser: 0.44 mg/dL (ref 0.44–1.00)
GFR calc non Af Amer: >60 mL/min (ref >60)
GLUCOSE: 75 mg/dL (ref 65–99)
Potassium: 2.9 mmol/L — ABNORMAL LOW (ref 3.5–5.1)
Sodium: 133 mmol/L — ABNORMAL LOW (ref 135–145)
TOTAL PROTEIN: 5.9 g/dL — AB (ref 6.5–8.1)

## 2016-11-27 LAB — POCT URINALYSIS DIP (DEVICE)
BILIRUBIN URINE: NEGATIVE
Glucose, UA: NEGATIVE mg/dL
HGB URINE DIPSTICK: NEGATIVE
KETONES UR: NEGATIVE mg/dL
LEUKOCYTES UA: NEGATIVE
NITRITE: NEGATIVE
Protein, ur: NEGATIVE mg/dL
Specific Gravity, Urine: 1.025 (ref 1.005–1.030)
Urobilinogen, UA: 0.2 mg/dL (ref 0.0–1.0)
pH: 6.5 (ref 5.0–8.0)

## 2016-11-27 LAB — GLUCOSE TOLERANCE, 1 HOUR: Glucose, 1 Hour GTT: 135 mg/dL (ref 70–140)

## 2016-11-27 LAB — ABO/RH: ABO/RH(D): B POS

## 2016-11-27 MED ORDER — PRENATAL MULTIVITAMIN CH
1.0000 | ORAL_TABLET | Freq: Every day | ORAL | Status: DC
  Administered 2016-11-27 – 2016-11-29 (×3): 1 via ORAL
  Filled 2016-11-27 (×3): qty 1

## 2016-11-27 MED ORDER — CALCIUM CARBONATE ANTACID 500 MG PO CHEW: 2.0000 | CHEWABLE_TABLET | ORAL | Status: DC | PRN

## 2016-11-27 MED ORDER — LABETALOL HCL 300 MG PO TABS
300.0000 mg | ORAL_TABLET | ORAL | Status: AC
  Administered 2016-11-27: 300 mg via ORAL
  Filled 2016-11-27: qty 1

## 2016-11-27 MED ORDER — POTASSIUM CHLORIDE CRYS ER 20 MEQ PO TBCR
20.0000 meq | EXTENDED_RELEASE_TABLET | Freq: Two times a day (BID) | ORAL | Status: DC
  Administered 2016-11-27 – 2016-11-29 (×5): 20 meq via ORAL
  Filled 2016-11-27 (×6): qty 1

## 2016-11-27 MED ORDER — ACETAMINOPHEN 325 MG PO TABS
650.0000 mg | ORAL_TABLET | ORAL | Status: DC | PRN
  Administered 2016-11-29 (×2): 650 mg via ORAL
  Filled 2016-11-27 (×2): qty 2

## 2016-11-27 MED ORDER — LABETALOL HCL 5 MG/ML IV SOLN
20.0000 mg | INTRAVENOUS | Status: AC | PRN
  Administered 2016-11-27: 80 mg via INTRAVENOUS
  Administered 2016-11-27: 40 mg via INTRAVENOUS
  Administered 2016-11-27: 20 mg via INTRAVENOUS
  Filled 2016-11-27: qty 8
  Filled 2016-11-27: qty 16
  Filled 2016-11-27: qty 4

## 2016-11-27 MED ORDER — SODIUM CHLORIDE 0.9% FLUSH
3.0000 mL | Freq: Two times a day (BID) | INTRAVENOUS | Status: DC
  Administered 2016-11-28 – 2016-11-29 (×4): 3 mL via INTRAVENOUS

## 2016-11-27 MED ORDER — LABETALOL HCL 100 MG PO TABS
300.0000 mg | ORAL_TABLET | Freq: Two times a day (BID) | ORAL | Status: DC
  Administered 2016-11-27: 300 mg via ORAL
  Filled 2016-11-27: qty 3

## 2016-11-27 MED ORDER — DOCUSATE SODIUM 100 MG PO CAPS
100.0000 mg | ORAL_CAPSULE | Freq: Every day | ORAL | Status: DC
  Administered 2016-11-27 – 2016-11-29 (×3): 100 mg via ORAL
  Filled 2016-11-27 (×3): qty 1

## 2016-11-27 MED ORDER — SODIUM CHLORIDE 0.9% FLUSH
3.0000 mL | INTRAVENOUS | Status: DC | PRN
  Administered 2016-11-29: 3 mL via INTRAVENOUS
  Filled 2016-11-27: qty 3

## 2016-11-27 MED ORDER — LABETALOL HCL 300 MG PO TABS
300.0000 mg | ORAL_TABLET | Freq: Three times a day (TID) | ORAL | Status: DC
  Administered 2016-11-27 – 2016-11-29 (×5): 300 mg via ORAL
  Filled 2016-11-27 (×6): qty 1

## 2016-11-27 MED ORDER — BETAMETHASONE SOD PHOS & ACET 6 (3-3) MG/ML IJ SUSP
12.0000 mg | INTRAMUSCULAR | Status: AC
  Administered 2016-11-27 – 2016-11-28 (×2): 12 mg via INTRAMUSCULAR
  Filled 2016-11-27 (×2): qty 2

## 2016-11-27 MED ORDER — SODIUM CHLORIDE 0.9 % IV SOLN
250.0000 mL | INTRAVENOUS | Status: DC | PRN
  Administered 2016-11-27: 11:00:00 via INTRAVENOUS

## 2016-11-27 MED ORDER — ZOLPIDEM TARTRATE 5 MG PO TABS: 5.0000 mg | ORAL_TABLET | Freq: Every evening | ORAL | Status: DC | PRN

## 2016-11-27 MED ORDER — HYDRALAZINE HCL 20 MG/ML IJ SOLN
10.0000 mg | Freq: Once | INTRAMUSCULAR | Status: AC | PRN
  Administered 2016-11-27: 10 mg via INTRAVENOUS
  Filled 2016-11-27: qty 1

## 2016-11-27 NOTE — Progress Notes (Signed)
[employer] # [phone] used for encounter.  Pt denies headaches and visual disturbances.

## 2016-11-27 NOTE — Progress Notes (Signed)
   PRENATAL VISIT NOTE  Subjective:  Monique Moon is a 41 y.o. [id number] at 7819w1d being seen today for ongoing prenatal care.  She is currently monitored for the following issues for this high-risk pregnancy and has HTN (hypertension); Language barrier; Obesity; Preexisting hypertension complicating pregnancy, antepartum; Supervision of high-risk pregnancy; Grand multipara; Advanced maternal age in multigravida, second trimester; and Hypertension in pregnancy, antepartum on her problem list.  Patient reports no complaints. She has not been seen here since 12/17. She did keep her MFM appointments and was sent to MAU with elevated BP 3/14 and had normal labs and was to f/u for 2x/wk testing at that time and had her Labetalol increased to 200 mg bid. She has not been taking her aspirin. She has not been seen since that visit. She has not filled her prescription or begun her new dose of labetalol. Contractions: Not present. Vag. Bleeding: None.  Movement: Present. Denies leaking of fluid.   The following portions of the patient's history were reviewed and updated as appropriate: allergies, current medications, past family history, past medical history, past social history, past surgical history and problem list. Problem list updated.  Objective:   Vitals:   11/27/16 0810  BP: (!) 169/96  Pulse: 84  Weight: 193 lb 1.6 oz (87.6 kg)    Fetal Status: Fetal Heart Rate (bpm): NST   Movement: Present     General:  Alert, oriented and cooperative. Patient is in no acute distress.  Skin: Skin is warm and dry. No rash noted.   Cardiovascular: Normal heart rate noted  Respiratory: Normal respiratory effort, no problems with respiration noted  Abdomen: Soft, gravid, appropriate for gestational age. Pain/Pressure: Absent     Pelvic:  Cervical exam deferred        Extremities: Normal range of motion.  Edema: None  Mental Status: Normal mood and affect. Normal behavior. Normal judgment and thought content.    NST reviewed and reactive. U/S 3/14 vtx, nml AFI, EFW 1947 gms (60%) Assessment and Plan:  Pregnancy: [id number] at 7519w1d  1. Preexisting hypertension complicating pregnancy, antepartum BP is in severe range--is this her CHTN or superimposed pre-eclampsia with severe features? It is impossible to tell and she has not been compliant or followed up as directed. Due to risks of this including stroke, fetal compromise, end organ damage think hospital admission is necessitated to try to determine the correct diagnosis. Had plt count of 137 2 wks ago.  If super-imposed pre-eclampsia with severe features, delivery would be indicated. She is quite concerned about her job and having money for rent.  She has not had diabetes testing either. - Fetal nonstress test  2. Supervision of high risk pregnancy, antepartum  General obstetric precautions including but not limited to vaginal bleeding, contractions, leaking of fluid and fetal movement were reviewed in detail with the patient. Please refer to After Visit Summary for other counseling recommendations.  Return in 1 week (on 12/04/2016).If she is still pregnant.   Reva Boresanya S Pratt, MD

## 2016-11-27 NOTE — H&P (Signed)
ANTEPARTUM ADMISSION HISTORY AND PHYSICAL NOTE   History of Present Illness: Monique Moon is a 41 y.o. [id number] at [redacted]w[redacted]d admitted for severe range blood pressures.  She was noted to have chronic hypertension at her initial visit to the MAU on 06/07/16 and was prescribed labetalol 100 mg BID.  She established care 08/16/16 and continued to have elevated blood pressures.  She was seen this morning in clinic where her blood pressure was 169/96.  She is asymptomatic; no headache, blurred vision, scotoma, dyspnea, chest pain, RUQ pain, edema.  Patient reports the fetal movement as active. Patient reports uterine contraction  activity as none. Patient reports  vaginal bleeding as none. Patient describes fluid per vagina as None. Fetal presentation is unsure.  Patient Active Problem List   Diagnosis Date Noted  . Hypertension in pregnancy, antepartum 11/27/2016  . Supervision of high-risk pregnancy 08/16/2016  . Grand multipara 08/16/2016  . Advanced maternal age in multigravida, second trimester 08/16/2016  . Preexisting hypertension complicating pregnancy, antepartum 06/07/2016  . HTN (hypertension) 02/07/2016  . Language barrier 02/07/2016  . Obesity 02/07/2016    Past Medical History:  Diagnosis Date  . HTN (hypertension)   . Malaria     Past Surgical History:  Procedure Laterality Date  . NO PAST SURGERIES      OB History  Gravida Para Term Preterm AB Living  9 8 8     8   SAB TAB Ectopic Multiple Live Births          8    # Outcome Date GA Lbr Len/2nd Weight Sex Delivery Anes PTL Lv  9 Current           8 Term 2015 [redacted]w[redacted]d    Vag-Spont   LIV     Birth Comments: no complications, born in [place]  7 Term 2012 [redacted]w[redacted]d    Vag-Spont   LIV     Birth Comments: no complications, born in [place]  6 Term 2010 [redacted]w[redacted]d    Vag-Spont   LIV     Birth Comments: no complications, born in [place]  5 Term 2008 [redacted]w[redacted]d    Vag-Spont   LIV     Birth Comments: born in [place], no complications.    4 Term 2005 [redacted]w[redacted]d    Vag-Spont   LIV     Birth Comments: no complications, born in [place]  3 Term 08/10/02 [redacted]w[redacted]d    Vag-Spont   LIV     Birth Comments: no complications, born in [place]  2 Term 1999 [redacted]w[redacted]d    Vag-Spont   LIV     Birth Comments: born in [place].no complications.   1 Term 1998 [redacted]w[redacted]d    Vag-Spont   LIV     Birth Comments: no complications, born in [place].       Social History   Social History  . Marital status: Married    Spouse name: N/A  . Number of children: 10  . Years of education: 0   Social History Main Topics  . Smoking status: Never Smoker  . Smokeless tobacco: Never Used  . Alcohol use No  . Drug use: No  . Sexual activity: Yes    Birth control/ protection: None   Other Topics Concern  . Not on file   Social History Narrative  . No narrative on file    No family history on file.  No Known Allergies  Prescriptions Prior to Admission  Medication Sig Dispense Refill Last Dose  . aspirin EC 81 MG tablet  Take 1 tablet (81 mg total) by mouth daily. (Patient not taking: Reported on 10/16/2016) 90 tablet 1 Not Taking  . labetalol (NORMODYNE) 100 MG tablet Take 2 tablets (200 mg total) by mouth 2 (two) times daily. (Patient not taking: Reported on 11/27/2016) 60 tablet 2 Not Taking  . Prenatal Vit-Fe Fumarate-FA (PREPLUS) 27-1 MG TABS Take 1 tablet by mouth daily. 30 tablet 13 Taking  Reports taking 2 prenatals twice a day and 1 of the 'blood pressure medicine' twice a day.  Not taking ASA.   Review of Systems - negative except as reported in HPI  Vitals:  BP (!) 173/99 (BP Location: Right Arm)   Pulse 78   Temp 98.4 F (36.9 C) (Oral)   Resp 19   LMP 04/02/2016   SpO2 99%  Physical Examination: CONSTITUTIONAL: Well-developed, well-nourished female in no acute distress.  HENT:  Normocephalic, atraumatic, External right and left ear normal. Oropharynx is clear and moist EYES: Conjunctivae and EOM are normal. Pupils are equal and round. No  scleral icterus.  NECK: Normal range of motion, supple, no masses SKIN: Skin is warm and dry. No rash noted. Not diaphoretic. No erythema. No pallor. NEUROLGIC: Alert and oriented to person, place, and time. Normal reflexes, muscle tone coordination. No cranial nerve deficit noted. PSYCHIATRIC: Normal mood and affect. Normal behavior. Normal judgment and thought content. CARDIOVASCULAR: Normal heart rate noted, regular rhythm RESPIRATORY: Effort and breath sounds normal, no problems with respiration noted ABDOMEN: Soft, nontender, nondistended, gravid. MUSCULOSKELETAL: Normal range of motion. Trace edema and no tenderness. 2+ distal pulses.  Cervix: deferred exam Membranes:intact Fetal Monitoring:Baseline: 145 bpm, Variability: Good {> 6 bpm), Accelerations: Reactive and Decelerations: Absent Tocometer: rare ctx  Labs:  Results for orders placed or performed in visit on 11/27/16 (from the past 24 hour(s))  POCT urinalysis dip (device)   Collection Time: 11/27/16  8:27 AM  Result Value Ref Range   Glucose, UA NEGATIVE NEGATIVE mg/dL   Bilirubin Urine NEGATIVE NEGATIVE   Ketones, ur NEGATIVE NEGATIVE mg/dL   Specific Gravity, Urine 1.025 1.005 - 1.030   Hgb urine dipstick NEGATIVE NEGATIVE   pH 6.5 5.0 - 8.0   Protein, ur NEGATIVE NEGATIVE mg/dL   Urobilinogen, UA 0.2 0.0 - 1.0 mg/dL   Nitrite NEGATIVE NEGATIVE   Leukocytes, UA NEGATIVE NEGATIVE    Imaging Studies: [place] Mfm Ob Follow Up  Result Date: 11/13/2016 ----------------------------------------------------------------------  OBSTETRICS REPORT                      (Signed Final 11/13/2016 10:59 am) ---------------------------------------------------------------------- Patient Info  ID #:       [id number]                         D.O.B.:   1975/10/29 (41 yrs)  Name:       Monique Moon                 Visit Date:  11/13/2016 10:21 am ---------------------------------------------------------------------- Performed By  Performed By:      Marcellina Millin          Secondary Phy.:   [facility]                    [facility]  Attending:        Clarene Critchley [facility]        Address:          [facility]                    MD                                                             [facility] [address]                                                             [address]                                                             [place], [place]                                                             [address]  Referred By:      Reva Bores          Location:         [facility]                    MD  Ref. Address:     [address]                    [place], [place]  [address] ---------------------------------------------------------------------- Orders   #  Description                                 Code   1  [place] MFM OB FOLLOW UP                         [id number]  ----------------------------------------------------------------------   #  Ordered By               Order #        Accession #    Episode #   1  MARK Ezzard Standing              [id number]      [id number]     [id number]  ---------------------------------------------------------------------- Indications   [redacted] weeks gestation of pregnancy                Z3A.20   Advanced maternal age primigravida 72+,        O77.513   third trimester   Hypertension - Chronic/Pre-existing            O10.019   Grand multiparity, antepartum                  O09.40  ---------------------------------------------------------------------- OB History  Blood Type:            Height:  5'5"   Weight (lb):  185      BMI:   30.78  Gravidity:    9         Term:   8        Prem:   0        SAB:   0  TOP:          0        Ectopic:  0        Living: 8 ---------------------------------------------------------------------- Fetal Evaluation  Num Of Fetuses:     1  Fetal Heart         149  Rate(bpm):  Cardiac Activity:   Observed  Presentation:       Cephalic  Placenta:           Anterior, above cervical os  P. Cord Insertion:  Visualized  Amniotic Fluid  AFI FV:      Subjectively within normal limits  AFI Sum(cm)     %Tile       Largest Pocket(cm)  16.08           58          6.24  RUQ(cm)       RLQ(cm)       LUQ(cm)        LLQ(cm)  6.24          3.09          3.03           3.72 ---------------------------------------------------------------------- Biometry  BPD:      76.5  mm     G. Age:  30w 5d          8  %    CI:        69.47   %   70 - 86  FL/HC:      19.8   %   19.1 - 21.3  HC:       293   mm     G. Age:  32w 2d         19  %    HC/AC:      0.99       0.96 - 1.17  AC:      296.8  mm     G. Age:  33w 5d         87  %    FL/BPD:     75.9   %   71 - 87  FL:       58.1  mm     G. Age:  30w 2d          6  %    FL/AC:      19.6   %   20 - 24  Est. FW:    1947  gm      4 lb 5 oz     60  % ---------------------------------------------------------------------- Gestational Age  LMP:           32w 1d       Date:   04/02/16                 EDD:   01/07/17  U/S Today:     31w 5d                                        EDD:   01/10/17  Best:          32w 1d    Det. By:   LMP  (04/02/16)          EDD:   01/07/17 ---------------------------------------------------------------------- Anatomy  Cranium:               Appears normal         Aortic Arch:            Previously seen  Cavum:                 Previously seen        Ductal Arch:            Previously seen  Ventricles:            Appears normal         Diaphragm:              Appears normal  Choroid Plexus:        Previously seen        Stomach:                Appears normal, left                                                                         sided  Cerebellum:            Previously seen        Abdomen:                Appears normal  Posterior  Fossa:       Previously seen        Abdominal Wall:         Previously seen  Nuchal Fold:           Previously seen        Cord Vessels:           Previously seen  Face:                  Orbits and profile     Kidneys:                Appear normal                         previously seen  Lips:                  Previously seen        Bladder:                Appears normal  Thoracic:              Appears normal         Spine:                  Previously seen  Heart:                 Previously seen        Upper Extremities:      Previously seen  RVOT:                  Appears normal         Lower Extremities:      Previously seen  LVOT:                  Appears normal  Other:  Female gender previously seen. Both Heels and 5th digits previously          seen. ---------------------------------------------------------------------- Cervix Uterus Adnexa  Cervix  Not visualized (advanced GA >29wks) ---------------------------------------------------------------------- Impression  Single IUP at 32w 1d  Advanced maternal age, chronic hypertension  Normal interval anatomy  Fetal growth is appropriate (60th %tile)  Anterior placenta without previa  Normal amniotic fluid volume  BPs: 167/99; repeat 162/103 ---------------------------------------------------------------------- Recommendations  Patient was escorted to MAU for further evaluation - rule out  preeclampsia  Recommend follow up in 4 weeks for interval growth ----------------------------------------------------------------------                Candis Shine, MD Electronically Signed Final Report   11/13/2016 10:59 am ----------------------------------------------------------------------    Assessment and Plan: Patient Active Problem List   Diagnosis Date Noted  . Hypertension in pregnancy, antepartum 11/27/2016  . Supervision of high-risk  pregnancy 08/16/2016  . Grand multipara 08/16/2016  . Advanced maternal age in multigravida, second trimester 08/16/2016  . Preexisting hypertension complicating pregnancy, antepartum 06/07/2016  . HTN (hypertension) 02/07/2016  . Language barrier 02/07/2016  . Obesity 02/07/2016   Admit to Antenatal - concern for uncontrolled htn vs SIPE.  IV labetalol, hydralazine PRN PO labetalol 300 mg BID Growth scan 3/14 60% CMP, CBC, P:C, 24h protein, A1c Betamethasone x2   Charlsie Merles, MD, PGY3  OB FELLOW POSTPARTUM PROGRESS NOTE ATTESTATION  I confirm that I have verified the information documented in the resident's note and that I have also personally reperformed the physical exam and all medical decision making activities.   Ernestina Penna, MD  9:31 AM

## 2016-11-27 NOTE — Patient Instructions (Signed)
 Third Trimester of Pregnancy The third trimester is from week 28 through week 40 (months 7 through 9). The third trimester is a time when the unborn baby (fetus) is growing rapidly. At the end of the ninth month, the fetus is about 20 inches in length and weighs 6-10 pounds. Body changes during your third trimester Your body will continue to go through many changes during pregnancy. The changes vary from woman to woman. During the third trimester:  Your weight will continue to increase. You can expect to gain 25-35 pounds (11-16 kg) by the end of the pregnancy.  You may begin to get stretch marks on your hips, abdomen, and breasts.  You may urinate more often because the fetus is moving lower into your pelvis and pressing on your bladder.  You may develop or continue to have heartburn. This is caused by increased hormones that slow down muscles in the digestive tract.  You may develop or continue to have constipation because increased hormones slow digestion and cause the muscles that push waste through your intestines to relax.  You may develop hemorrhoids. These are swollen veins (varicose veins) in the rectum that can itch or be painful.  You may develop swollen, bulging veins (varicose veins) in your legs.  You may have increased body aches in the pelvis, back, or thighs. This is due to weight gain and increased hormones that are relaxing your joints.  You may have changes in your hair. These can include thickening of your hair, rapid growth, and changes in texture. Some women also have hair loss during or after pregnancy, or hair that feels dry or thin. Your hair will most likely return to normal after your baby is born.  Your breasts will continue to grow and they will continue to become tender. A yellow fluid (colostrum) may leak from your breasts. This is the first milk you are producing for your baby.  Your belly button may stick out.  You may notice more swelling in your  hands, face, or ankles.  You may have increased tingling or numbness in your hands, arms, and legs. The skin on your belly may also feel numb.  You may feel short of breath because of your expanding uterus.  You may have more problems sleeping. This can be caused by the size of your belly, increased need to urinate, and an increase in your body's metabolism.  You may notice the fetus "dropping," or moving lower in your abdomen (lightening).  You may have increased vaginal discharge.  You may notice your joints feel loose and you may have pain around your pelvic bone.  What to expect at prenatal visits You will have prenatal exams every 2 weeks until week 36. Then you will have weekly prenatal exams. During a routine prenatal visit:  You will be weighed to make sure you and the baby are growing normally.  Your blood pressure will be taken.  Your abdomen will be measured to track your baby's growth.  The fetal heartbeat will be listened to.  Any test results from the previous visit will be discussed.  You may have a cervical check near your due date to see if your cervix has softened or thinned (effaced).  You will be tested for Group B streptococcus. This happens between 35 and 37 weeks.  Your health care provider may ask you:  What your birth plan is.  How you are feeling.  If you are feeling the baby move.  If you have   had any abnormal symptoms, such as leaking fluid, bleeding, severe headaches, or abdominal cramping.  If you are using any tobacco products, including cigarettes, chewing tobacco, and electronic cigarettes.  If you have any questions.  Other tests or screenings that may be performed during your third trimester include:  Blood tests that check for low iron levels (anemia).  Fetal testing to check the health, activity level, and growth of the fetus. Testing is done if you have certain medical conditions or if there are problems during the  pregnancy.  Nonstress test (NST). This test checks the health of your baby to make sure there are no signs of problems, such as the baby not getting enough oxygen. During this test, a belt is placed around your belly. The baby is made to move, and its heart rate is monitored during movement.  What is false labor? False labor is a condition in which you feel small, irregular tightenings of the muscles in the womb (contractions) that usually go away with rest, changing position, or drinking water. These are called Braxton Hicks contractions. Contractions may last for hours, days, or even weeks before true labor sets in. If contractions come at regular intervals, become more frequent, increase in intensity, or become painful, you should see your health care provider. What are the signs of labor?  Abdominal cramps.  Regular contractions that start at 10 minutes apart and become stronger and more frequent with time.  Contractions that start on the top of the uterus and spread down to the lower abdomen and back.  Increased pelvic pressure and dull back pain.  A watery or bloody mucus discharge that comes from the vagina.  Leaking of amniotic fluid. This is also known as your "water breaking." It could be a slow trickle or a gush. Let your health care provider know if it has a color or strange odor. If you have any of these signs, call your health care provider right away, even if it is before your due date. Follow these instructions at home: Medicines  Follow your health care provider's instructions regarding medicine use. Specific medicines may be either safe or unsafe to take during pregnancy.  Take a prenatal vitamin that contains at least 600 micrograms (mcg) of folic acid.  If you develop constipation, try taking a stool softener if your health care provider approves. Eating and drinking  Eat a balanced diet that includes fresh fruits and vegetables, whole grains, good sources of protein  such as meat, eggs, or tofu, and low-fat dairy. Your health care provider will help you determine the amount of weight gain that is right for you.  Avoid raw meat and uncooked cheese. These carry germs that can cause birth defects in the baby.  If you have low calcium intake from food, talk to your health care provider about whether you should take a daily calcium supplement.  Eat four or five small meals rather than three large meals a day.  Limit foods that are high in fat and processed sugars, such as fried and sweet foods.  To prevent constipation: ? Drink enough fluid to keep your urine clear or pale yellow. ? Eat foods that are high in fiber, such as fresh fruits and vegetables, whole grains, and beans. Activity  Exercise only as directed by your health care provider. Most women can continue their usual exercise routine during pregnancy. Try to exercise for 30 minutes at least 5 days a week. Stop exercising if you experience uterine contractions.  Avoid   heavy lifting.  Do not exercise in extreme heat or humidity, or at high altitudes.  Wear low-heel, comfortable shoes.  Practice good posture.  You may continue to have sex unless your health care provider tells you otherwise. Relieving pain and discomfort  Take frequent breaks and rest with your legs elevated if you have leg cramps or low back pain.  Take warm sitz baths to soothe any pain or discomfort caused by hemorrhoids. Use hemorrhoid cream if your health care provider approves.  Wear a good support bra to prevent discomfort from breast tenderness.  If you develop varicose veins: ? Wear support pantyhose or compression stockings as told by your healthcare provider. ? Elevate your feet for 15 minutes, 3-4 times a day. Prenatal care  Write down your questions. Take them to your prenatal visits.  Keep all your prenatal visits as told by your health care provider. This is important. Safety  Wear your seat belt at  all times when driving.  Make a list of emergency phone numbers, including numbers for family, friends, the hospital, and police and fire departments. General instructions  Avoid cat litter boxes and soil used by cats. These carry germs that can cause birth defects in the baby. If you have a cat, ask someone to clean the litter box for you.  Do not travel far distances unless it is absolutely necessary and only with the approval of your health care provider.  Do not use hot tubs, steam rooms, or saunas.  Do not drink alcohol.  Do not use any products that contain nicotine or tobacco, such as cigarettes and e-cigarettes. If you need help quitting, ask your health care provider.  Do not use any medicinal herbs or unprescribed drugs. These chemicals affect the formation and growth of the baby.  Do not douche or use tampons or scented sanitary pads.  Do not cross your legs for long periods of time.  To prepare for the arrival of your baby: ? Take prenatal classes to understand, practice, and ask questions about labor and delivery. ? Make a trial run to the hospital. ? Visit the hospital and tour the maternity area. ? Arrange for maternity or paternity leave through employers. ? Arrange for family and friends to take care of pets while you are in the hospital. ? Purchase a rear-facing car seat and make sure you know how to install it in your car. ? Pack your hospital bag. ? Prepare the baby's nursery. Make sure to remove all pillows and stuffed animals from the baby's crib to prevent suffocation.  Visit your dentist if you have not gone during your pregnancy. Use a soft toothbrush to brush your teeth and be gentle when you floss. Contact a health care provider if:  You are unsure if you are in labor or if your water has broken.  You become dizzy.  You have mild pelvic cramps, pelvic pressure, or nagging pain in your abdominal area.  You have lower back pain.  You have persistent  nausea, vomiting, or diarrhea.  You have an unusual or bad smelling vaginal discharge.  You have pain when you urinate. Get help right away if:  Your water breaks before 37 weeks.  You have regular contractions less than 5 minutes apart before 37 weeks.  You have a fever.  You are leaking fluid from your vagina.  You have spotting or bleeding from your vagina.  You have severe abdominal pain or cramping.  You have rapid weight loss or weight   gain.  You have shortness of breath with chest pain.  You notice sudden or extreme swelling of your face, hands, ankles, feet, or legs.  Your baby makes fewer than 10 movements in 2 hours.  You have severe headaches that do not go away when you take medicine.  You have vision changes. Summary  The third trimester is from week 28 through week 40, months 7 through 9. The third trimester is a time when the unborn baby (fetus) is growing rapidly.  During the third trimester, your discomfort may increase as you and your baby continue to gain weight. You may have abdominal, leg, and back pain, sleeping problems, and an increased need to urinate.  During the third trimester your breasts will keep growing and they will continue to become tender. A yellow fluid (colostrum) may leak from your breasts. This is the first milk you are producing for your baby.  False labor is a condition in which you feel small, irregular tightenings of the muscles in the womb (contractions) that eventually go away. These are called Braxton Hicks contractions. Contractions may last for hours, days, or even weeks before true labor sets in.  Signs of labor can include: abdominal cramps; regular contractions that start at 10 minutes apart and become stronger and more frequent with time; watery or bloody mucus discharge that comes from the vagina; increased pelvic pressure and dull back pain; and leaking of amniotic fluid. This information is not intended to replace advice  given to you by your health care provider. Make sure you discuss any questions you have with your health care provider. Document Released: 08/13/2001 Document Revised: 01/25/2016 Document Reviewed: 10/20/2012 [employer] Interactive Patient Education  2017 [employer]   Breastfeeding Deciding to breastfeed is one of the best choices you can make for you and your baby. A change in hormones during pregnancy causes your breast tissue to grow and increases the number and size of your milk ducts. These hormones also allow proteins, sugars, and fats from your blood supply to make breast milk in your milk-producing glands. Hormones prevent breast milk from being released before your baby is born as well as prompt milk flow after birth. Once breastfeeding has begun, thoughts of your baby, as well as his or her sucking or crying, can stimulate the release of milk from your milk-producing glands. Benefits of breastfeeding For Your Baby  Your first milk (colostrum) helps your baby's digestive system function better.  There are antibodies in your milk that help your baby fight off infections.  Your baby has a lower incidence of asthma, allergies, and sudden infant death syndrome.  The nutrients in breast milk are better for your baby than infant formulas and are designed uniquely for your baby's needs.  Breast milk improves your baby's brain development.  Your baby is less likely to develop other conditions, such as childhood obesity, asthma, or type 2 diabetes mellitus.  For You  Breastfeeding helps to create a very special bond between you and your baby.  Breastfeeding is convenient. Breast milk is always available at the correct temperature and costs nothing.  Breastfeeding helps to burn calories and helps you lose the weight gained during pregnancy.  Breastfeeding makes your uterus contract to its prepregnancy size faster and slows bleeding (lochia) after you give birth.  Breastfeeding helps  to lower your risk of developing type 2 diabetes mellitus, osteoporosis, and breast or ovarian cancer later in life.  Signs that your baby is hungry Early Signs of Hunger    Increased alertness or activity.  Stretching.  Movement of the head from side to side.  Movement of the head and opening of the mouth when the corner of the mouth or cheek is stroked (rooting).  Increased sucking sounds, smacking lips, cooing, sighing, or squeaking.  Hand-to-mouth movements.  Increased sucking of fingers or hands.  Late Signs of Hunger  Fussing.  Intermittent crying.  Extreme Signs of Hunger Signs of extreme hunger will require calming and consoling before your baby will be able to breastfeed successfully. Do not wait for the following signs of extreme hunger to occur before you initiate breastfeeding:  Restlessness.  A loud, strong cry.  Screaming.  Breastfeeding basics Breastfeeding Initiation  Find a comfortable place to sit or lie down, with your neck and back well supported.  Place a pillow or rolled up blanket under your baby to bring him or her to the level of your breast (if you are seated). Nursing pillows are specially designed to help support your arms and your baby while you breastfeed.  Make sure that your baby's abdomen is facing your abdomen.  Gently massage your breast. With your fingertips, massage from your chest wall toward your nipple in a circular motion. This encourages milk flow. You may need to continue this action during the feeding if your milk flows slowly.  Support your breast with 4 fingers underneath and your thumb above your nipple. Make sure your fingers are well away from your nipple and your baby's mouth.  Stroke your baby's lips gently with your finger or nipple.  When your baby's mouth is open wide enough, quickly bring your baby to your breast, placing your entire nipple and as much of the colored area around your nipple (areola) as possible into  your baby's mouth. ? More areola should be visible above your baby's upper lip than below the lower lip. ? Your baby's tongue should be between his or her lower gum and your breast.  Ensure that your baby's mouth is correctly positioned around your nipple (latched). Your baby's lips should create a seal on your breast and be turned out (everted).  It is common for your baby to suck about 2-3 minutes in order to start the flow of breast milk.  Latching Teaching your baby how to latch on to your breast properly is very important. An improper latch can cause nipple pain and decreased milk supply for you and poor weight gain in your baby. Also, if your baby is not latched onto your nipple properly, he or she may swallow some air during feeding. This can make your baby fussy. Burping your baby when you switch breasts during the feeding can help to get rid of the air. However, teaching your baby to latch on properly is still the best way to prevent fussiness from swallowing air while breastfeeding. Signs that your baby has successfully latched on to your nipple:  Silent tugging or silent sucking, without causing you pain.  Swallowing heard between every 3-4 sucks.  Muscle movement above and in front of his or her ears while sucking.  Signs that your baby has not successfully latched on to nipple:  Sucking sounds or smacking sounds from your baby while breastfeeding.  Nipple pain.  If you think your baby has not latched on correctly, slip your finger into the corner of your baby's mouth to break the suction and place it between your baby's gums. Attempt breastfeeding initiation again. Signs of Successful Breastfeeding Signs from your baby:  A   gradual decrease in the number of sucks or complete cessation of sucking.  Falling asleep.  Relaxation of his or her body.  Retention of a small amount of milk in his or her mouth.  Letting go of your breast by himself or herself.  Signs from  you:  Breasts that have increased in firmness, weight, and size 1-3 hours after feeding.  Breasts that are softer immediately after breastfeeding.  Increased milk volume, as well as a change in milk consistency and color by the fifth day of breastfeeding.  Nipples that are not sore, cracked, or bleeding.  Signs That Your Baby is Getting Enough Milk  Wetting at least 1-2 diapers during the first 24 hours after birth.  Wetting at least 5-6 diapers every 24 hours for the first week after birth. The urine should be clear or pale yellow by 5 days after birth.  Wetting 6-8 diapers every 24 hours as your baby continues to grow and develop.  At least 3 stools in a 24-hour period by age 5 days. The stool should be soft and yellow.  At least 3 stools in a 24-hour period by age 7 days. The stool should be seedy and yellow.  No loss of weight greater than 10% of birth weight during the first 3 days of age.  Average weight gain of 4-7 ounces (113-198 g) per week after age 4 days.  Consistent daily weight gain by age 5 days, without weight loss after the age of 2 weeks.  After a feeding, your baby may spit up a small amount. This is common. Breastfeeding frequency and duration Frequent feeding will help you make more milk and can prevent sore nipples and breast engorgement. Breastfeed when you feel the need to reduce the fullness of your breasts or when your baby shows signs of hunger. This is called "breastfeeding on demand." Avoid introducing a pacifier to your baby while you are working to establish breastfeeding (the first 4-6 weeks after your baby is born). After this time you may choose to use a pacifier. Research has shown that pacifier use during the first year of a baby's life decreases the risk of sudden infant death syndrome (SIDS). Allow your baby to feed on each breast as long as he or she wants. Breastfeed until your baby is finished feeding. When your baby unlatches or falls asleep  while feeding from the first breast, offer the second breast. Because newborns are often sleepy in the first few weeks of life, you may need to awaken your baby to get him or her to feed. Breastfeeding times will vary from baby to baby. However, the following rules can serve as a guide to help you ensure that your baby is properly fed:  Newborns (babies 4 weeks of age or younger) may breastfeed every 1-3 hours.  Newborns should not go longer than 3 hours during the day or 5 hours during the night without breastfeeding.  You should breastfeed your baby a minimum of 8 times in a 24-hour period until you begin to introduce solid foods to your baby at around 6 months of age.  Breast milk pumping Pumping and storing breast milk allows you to ensure that your baby is exclusively fed your breast milk, even at times when you are unable to breastfeed. This is especially important if you are going back to work while you are still breastfeeding or when you are not able to be present during feedings. Your lactation consultant can give you guidelines on how   long it is safe to store breast milk. A breast pump is a machine that allows you to pump milk from your breast into a sterile bottle. The pumped breast milk can then be stored in a refrigerator or freezer. Some breast pumps are operated by hand, while others use electricity. Ask your lactation consultant which type will work best for you. Breast pumps can be purchased, but some hospitals and breastfeeding support groups lease breast pumps on a monthly basis. A lactation consultant can teach you how to hand express breast milk, if you prefer not to use a pump. Caring for your breasts while you breastfeed Nipples can become dry, cracked, and sore while breastfeeding. The following recommendations can help keep your breasts moisturized and healthy:  Avoid using soap on your nipples.  Wear a supportive bra. Although not required, special nursing bras and tank  tops are designed to allow access to your breasts for breastfeeding without taking off your entire bra or top. Avoid wearing underwire-style bras or extremely tight bras.  Air dry your nipples for 3-4minutes after each feeding.  Use only cotton bra pads to absorb leaked breast milk. Leaking of breast milk between feedings is normal.  Use lanolin on your nipples after breastfeeding. Lanolin helps to maintain your skin's normal moisture barrier. If you use pure lanolin, you do not need to wash it off before feeding your baby again. Pure lanolin is not toxic to your baby. You may also hand express a few drops of breast milk and gently massage that milk into your nipples and allow the milk to air dry.  In the first few weeks after giving birth, some women experience extremely full breasts (engorgement). Engorgement can make your breasts feel heavy, warm, and tender to the touch. Engorgement peaks within 3-5 days after you give birth. The following recommendations can help ease engorgement:  Completely empty your breasts while breastfeeding or pumping. You may want to start by applying warm, moist heat (in the shower or with warm water-soaked hand towels) just before feeding or pumping. This increases circulation and helps the milk flow. If your baby does not completely empty your breasts while breastfeeding, pump any extra milk after he or she is finished.  Wear a snug bra (nursing or regular) or tank top for 1-2 days to signal your body to slightly decrease milk production.  Apply ice packs to your breasts, unless this is too uncomfortable for you.  Make sure that your baby is latched on and positioned properly while breastfeeding.  If engorgement persists after 48 hours of following these recommendations, contact your health care provider or a lactation consultant. Overall health care recommendations while breastfeeding  Eat healthy foods. Alternate between meals and snacks, eating 3 of each per  day. Because what you eat affects your breast milk, some of the foods may make your baby more irritable than usual. Avoid eating these foods if you are sure that they are negatively affecting your baby.  Drink milk, fruit juice, and water to satisfy your thirst (about 10 glasses a day).  Rest often, relax, and continue to take your prenatal vitamins to prevent fatigue, stress, and anemia.  Continue breast self-awareness checks.  Avoid chewing and smoking tobacco. Chemicals from cigarettes that pass into breast milk and exposure to secondhand smoke may harm your baby.  Avoid alcohol and drug use, including marijuana. Some medicines that may be harmful to your baby can pass through breast milk. It is important to ask your health care   provider before taking any medicine, including all over-the-counter and prescription medicine as well as vitamin and herbal supplements. It is possible to become pregnant while breastfeeding. If birth control is desired, ask your health care provider about options that will be safe for your baby. Contact a health care provider if:  You feel like you want to stop breastfeeding or have become frustrated with breastfeeding.  You have painful breasts or nipples.  Your nipples are cracked or bleeding.  Your breasts are red, tender, or warm.  You have a swollen area on either breast.  You have a fever or chills.  You have nausea or vomiting.  You have drainage other than breast milk from your nipples.  Your breasts do not become full before feedings by the fifth day after you give birth.  You feel sad and depressed.  Your baby is too sleepy to eat well.  Your baby is having trouble sleeping.  Your baby is wetting less than 3 diapers in a 24-hour period.  Your baby has less than 3 stools in a 24-hour period.  Your baby's skin or the white part of his or her eyes becomes yellow.  Your baby is not gaining weight by 5 days of age. Get help right away  if:  Your baby is overly tired (lethargic) and does not want to wake up and feed.  Your baby develops an unexplained fever. This information is not intended to replace advice given to you by your health care provider. Make sure you discuss any questions you have with your health care provider. Document Released: 08/19/2005 Document Revised: 01/31/2016 Document Reviewed: 02/10/2013 Elsevier Interactive Patient Education  2017 Elsevier Inc.  

## 2016-11-28 ENCOUNTER — Encounter (HOSPITAL_COMMUNITY): Payer: Self-pay

## 2016-11-28 DIAGNOSIS — O10913 Unspecified pre-existing hypertension complicating pregnancy, third trimester: Secondary | ICD-10-CM | POA: Diagnosis not present

## 2016-11-28 DIAGNOSIS — Z6832 Body mass index (BMI) 32.0-32.9, adult: Secondary | ICD-10-CM | POA: Diagnosis not present

## 2016-11-28 DIAGNOSIS — O99214 Obesity complicating childbirth: Secondary | ICD-10-CM | POA: Diagnosis present

## 2016-11-28 DIAGNOSIS — O114 Pre-existing hypertension with pre-eclampsia, complicating childbirth: Secondary | ICD-10-CM | POA: Diagnosis present

## 2016-11-28 DIAGNOSIS — O10019 Pre-existing essential hypertension complicating pregnancy, unspecified trimester: Secondary | ICD-10-CM | POA: Diagnosis not present

## 2016-11-28 DIAGNOSIS — O0993 Supervision of high risk pregnancy, unspecified, third trimester: Secondary | ICD-10-CM | POA: Diagnosis not present

## 2016-11-28 DIAGNOSIS — Z3A34 34 weeks gestation of pregnancy: Secondary | ICD-10-CM

## 2016-11-28 DIAGNOSIS — O10013 Pre-existing essential hypertension complicating pregnancy, third trimester: Secondary | ICD-10-CM | POA: Diagnosis not present

## 2016-11-28 DIAGNOSIS — O1002 Pre-existing essential hypertension complicating childbirth: Secondary | ICD-10-CM | POA: Diagnosis present

## 2016-11-28 DIAGNOSIS — E669 Obesity, unspecified: Secondary | ICD-10-CM | POA: Diagnosis present

## 2016-11-28 LAB — PROTEIN, URINE, 24 HOUR
Collection Interval-UPROT: 24 hours
PROTEIN, URINE: 13 mg/dL
Protein, 24H Urine: 133 mg/d — ABNORMAL HIGH (ref 50–100)
Urine Total Volume-UPROT: 1025 mL

## 2016-11-28 LAB — HEMOGLOBIN A1C
HEMOGLOBIN A1C: 5 % (ref 4.8–5.6)
MEAN PLASMA GLUCOSE: 97 mg/dL

## 2016-11-28 NOTE — Progress Notes (Signed)
Assessment done using [employer] interpreters.

## 2016-11-28 NOTE — Progress Notes (Signed)
Patient ID: Monique OctaveFitina Jividen, female   DOB: 04/04/1976, 41 y.o.   MRN: [id number]  FACULTY PRACTICE ANTEPARTUM(COMPREHENSIVE) NOTE  Monique Moon is a 41 y.o. [id number] with Estimated Date of Delivery: 01/07/17   By  LMP, midtrimester ultrasound [id number]  who is admitted for managemtn of elevated BP in a chronic hypertensive patient.    Fetal presentation is unsure. Length of Stay:  1  Days  Date of admission:11/27/2016  Subjective: Pt denies headache or visual changes this am Patient reports the fetal movement as active. Patient reports uterine contraction  activity as none. Patient reports  vaginal bleeding as none. Patient describes fluid per vagina as None.  Vitals:  Blood pressure (!) 157/90, pulse 95, temperature 98.6 F (37 C), temperature source Oral, resp. rate 16, height 5\' 5"  (1.651 m), weight 188 lb (85.3 kg), last menstrual period 04/02/2016, SpO2 95 %. Vitals:   11/28/16 0324 11/28/16 0614 11/28/16 0737 11/28/16 0739  BP: (!) 143/94  (!) 157/90   Pulse: (!) 102  (!) 102 95  Resp: 16  16   Temp: 98.2 F (36.8 C)  98.6 F (37 C)   TempSrc: Oral  Oral   SpO2:    95%  Weight:  188 lb (85.3 kg)    Height:       Physical Examination:  General appearance - alert, well appearing, and in no distress Fundal Height:  size equals dates Pelvic Exam:  normal external genitalia, vulva, vagina, cervix, uterus and adnexa, examination not indicated Cervical Exam: Not evaluated.  Extremities: extremities normal, atraumatic, no cyanosis or edema with DTRs 2+ bilaterally Membranes:intact  Fetal Monitoring:     Category I tracing, reactive  Labs:  Results for orders placed or performed during the hospital encounter of 11/27/16 (from the past 24 hour(s))  CBC with Differential   Collection Time: 11/27/16 11:23 AM  Result Value Ref Range   WBC 6.7 4.0 - 10.5 K/uL   RBC 3.97 3.87 - 5.11 MIL/uL   Hemoglobin 11.4 (L) 12.0 - 15.0 g/dL   HCT 69.632.9 (L) 29.536.0 - 28.446.0 %   MCV 82.9 78.0 - 100.0 fL   MCH 28.7 26.0 - 34.0 pg   MCHC 34.7 30.0 - 36.0 g/dL   RDW 13.213.3 44.011.5 - 10.215.5 %   Platelets 131 (L) 150 - 400 K/uL   Neutrophils Relative % 70 %   Neutro Abs 4.7 1.7 - 7.7 K/uL   Lymphocytes Relative 22 %   Lymphs Abs 1.5 0.7 - 4.0 K/uL   Monocytes Relative 6 %   Monocytes Absolute 0.4 0.1 - 1.0 K/uL   Eosinophils Relative 2 %   Eosinophils Absolute 0.1 0.0 - 0.7 K/uL   Basophils Relative 0 %   Basophils Absolute 0.0 0.0 - 0.1 K/uL  Comprehensive metabolic panel   Collection Time: 11/27/16 11:23 AM  Result Value Ref Range   Sodium 133 (L) 135 - 145 mmol/L   Potassium 2.9 (L) 3.5 - 5.1 mmol/L   Chloride 104 101 - 111 mmol/L   CO2 22 22 - 32 mmol/L   Glucose, Bld 75 65 - 99 mg/dL   BUN <5 (L) 6 - 20 mg/dL   Creatinine, Ser 7.250.44 0.44 - 1.00 mg/dL   Calcium 7.8 (L) 8.9 - 10.3 mg/dL   Total Protein 5.9 (L) 6.5 - 8.1 g/dL   Albumin 2.7 (L) 3.5 - 5.0 g/dL   AST 20 15 - 41 U/L   ALT 16 14 - 54 U/L   Alkaline Phosphatase 68  38 - 126 U/L   Total Bilirubin 0.6 0.3 - 1.2 mg/dL   GFR calc non Af Amer >60 >60 mL/min   GFR calc Af Amer >60 >60 mL/min   Anion gap 7 5 - 15  Hemoglobin A1c   Collection Time: 11/27/16 11:23 AM  Result Value Ref Range   Hgb A1c MFr Bld 5.0 4.8 - 5.6 %   Mean Plasma Glucose 97 mg/dL  Type and screen   Collection Time: 11/27/16 11:30 AM  Result Value Ref Range   ABO/RH(D) B POS    Antibody Screen NEG    Sample Expiration 11/30/2016   ABO/Rh   Collection Time: 11/27/16 11:30 AM  Result Value Ref Range   ABO/RH(D) B POS   Glucose tolerance, 1 hour   Collection Time: 11/27/16  1:49 PM  Result Value Ref Range   Glucose, 1 Hour GTT 135 70 - 140 mg/dL  Protein / creatinine ratio, urine   Collection Time: 11/27/16  2:20 PM  Result Value Ref Range   Creatinine, Urine 171.00 mg/dL   Total Protein, Urine 20 mg/dL   Protein Creatinine Ratio 0.12 0.00 - 0.15 mg/mg[Cre]    Imaging Studies:      Medications:  Scheduled . betamethasone acetate-betamethasone  sodium phosphate  12 mg Intramuscular Q24 Hr x 2  . docusate sodium  100 mg Oral Daily  . labetalol  300 mg Oral Q8H  . potassium chloride  20 mEq Oral BID  . prenatal multivitamin  1 tablet Oral Q1200  . sodium chloride flush  3 mL Intravenous Q12H   I have reviewed the patient's current medications.  ASSESSMENT: [id number] [redacted]w[redacted]d Estimated Date of Delivery: 01/07/17  Patient Active Problem List   Diagnosis Date Noted  . Hypertension in pregnancy, antepartum 11/27/2016  . Supervision of high-risk pregnancy 08/16/2016  . Grand multipara 08/16/2016  . Advanced maternal age in multigravida, second trimester 08/16/2016  . Preexisting hypertension complicating pregnancy, antepartum 06/07/2016  . HTN (hypertension) 02/07/2016  . Language barrier 02/07/2016  . Obesity 02/07/2016    PLAN: BP is stable on increased dosage of labetalol 300 TID Receiving 2nd dose of betamethasone today Finishing 24 hour urine around noon If BP remain in appropriate range may be discharged tomorrow  Julianne Chamberlin H 11/28/2016,9:14 AM

## 2016-11-29 DIAGNOSIS — O119 Pre-existing hypertension with pre-eclampsia, unspecified trimester: Secondary | ICD-10-CM | POA: Diagnosis present

## 2016-11-29 DIAGNOSIS — O10019 Pre-existing essential hypertension complicating pregnancy, unspecified trimester: Secondary | ICD-10-CM

## 2016-11-29 DIAGNOSIS — Z8759 Personal history of other complications of pregnancy, childbirth and the puerperium: Secondary | ICD-10-CM | POA: Diagnosis present

## 2016-11-29 LAB — CBC
HCT: 35.9 % — ABNORMAL LOW (ref 36.0–46.0)
HEMATOCRIT: 33.3 % — AB (ref 36.0–46.0)
HEMOGLOBIN: 11.3 g/dL — AB (ref 12.0–15.0)
Hemoglobin: 12 g/dL (ref 12.0–15.0)
MCH: 28.5 pg (ref 26.0–34.0)
MCH: 28.4 pg (ref 26.0–34.0)
MCHC: 33.9 g/dL (ref 30.0–36.0)
MCHC: 33.4 g/dL (ref 30.0–36.0)
MCV: 83.9 fL (ref 78.0–100.0)
MCV: 84.9 fL (ref 78.0–100.0)
PLATELETS: 144 10*3/uL — AB (ref 150–400)
Platelets: 133 10*3/uL — ABNORMAL LOW (ref 150–400)
RBC: 3.97 MIL/uL (ref 3.87–5.11)
RBC: 4.23 MIL/uL (ref 3.87–5.11)
RDW: 13.6 % (ref 11.5–15.5)
RDW: 13.8 % (ref 11.5–15.5)
WBC: 13.1 10*3/uL — ABNORMAL HIGH (ref 4.0–10.5)
WBC: 14 10*3/uL — AB (ref 4.0–10.5)

## 2016-11-29 LAB — COMPREHENSIVE METABOLIC PANEL
ALBUMIN: 2.7 g/dL — AB (ref 3.5–5.0)
ALK PHOS: 69 U/L (ref 38–126)
ALT: 18 U/L (ref 14–54)
ANION GAP: 6 (ref 5–15)
AST: 20 U/L (ref 15–41)
BILIRUBIN TOTAL: 0.6 mg/dL (ref 0.3–1.2)
BUN: 6 mg/dL (ref 6–20)
CALCIUM: 8.2 mg/dL — AB (ref 8.9–10.3)
CO2: 22 mmol/L (ref 22–32)
Chloride: 109 mmol/L (ref 101–111)
Creatinine, Ser: 0.43 mg/dL — ABNORMAL LOW (ref 0.44–1.00)
Glucose, Bld: 115 mg/dL — ABNORMAL HIGH (ref 65–99)
POTASSIUM: 3.4 mmol/L — AB (ref 3.5–5.1)
Sodium: 137 mmol/L (ref 135–145)
TOTAL PROTEIN: 6 g/dL — AB (ref 6.5–8.1)

## 2016-11-29 MED ORDER — OXYCODONE-ACETAMINOPHEN 5-325 MG PO TABS: 1.0000 | ORAL_TABLET | ORAL | Status: DC | PRN

## 2016-11-29 MED ORDER — MISOPROSTOL 50MCG HALF TABLET
50.0000 ug | ORAL_TABLET | Freq: Once | ORAL | Status: AC
  Administered 2016-11-29: 50 ug via ORAL
  Filled 2016-11-29: qty 1

## 2016-11-29 MED ORDER — PENICILLIN G POT IN DEXTROSE 60000 UNIT/ML IV SOLN
3.0000 10*6.[IU] | INTRAVENOUS | Status: DC
  Administered 2016-11-29 – 2016-11-30 (×4): 3 10*6.[IU] via INTRAVENOUS
  Filled 2016-11-29 (×6): qty 50

## 2016-11-29 MED ORDER — LABETALOL HCL 200 MG PO TABS
600.0000 mg | ORAL_TABLET | Freq: Three times a day (TID) | ORAL | Status: DC
  Administered 2016-11-29 – 2016-11-30 (×2): 600 mg via ORAL
  Filled 2016-11-29 (×3): qty 2

## 2016-11-29 MED ORDER — BUTORPHANOL TARTRATE 1 MG/ML IJ SOLN: 1.0000 mg | INTRAMUSCULAR | Status: DC | PRN

## 2016-11-29 MED ORDER — LABETALOL HCL 5 MG/ML IV SOLN
20.0000 mg | INTRAVENOUS | Status: DC | PRN
  Administered 2016-11-30: 20 mg via INTRAVENOUS
  Filled 2016-11-29 (×2): qty 4

## 2016-11-29 MED ORDER — PENICILLIN G POTASSIUM 5000000 UNITS IJ SOLR
5.0000 10*6.[IU] | Freq: Once | INTRAVENOUS | Status: AC
  Administered 2016-11-29: 5 10*6.[IU] via INTRAVENOUS
  Filled 2016-11-29: qty 5

## 2016-11-29 MED ORDER — MAGNESIUM SULFATE 40 G IN LACTATED RINGERS - SIMPLE
2.0000 g/h | INTRAVENOUS | Status: DC
  Administered 2016-11-30: 2 g/h via INTRAVENOUS
  Filled 2016-11-29 (×2): qty 500

## 2016-11-29 MED ORDER — LIDOCAINE HCL (PF) 1 % IJ SOLN
30.0000 mL | INTRAMUSCULAR | Status: DC | PRN
  Filled 2016-11-29: qty 30

## 2016-11-29 MED ORDER — HYDRALAZINE HCL 20 MG/ML IJ SOLN
10.0000 mg | Freq: Once | INTRAMUSCULAR | Status: DC | PRN
  Filled 2016-11-29: qty 1

## 2016-11-29 MED ORDER — OXYCODONE-ACETAMINOPHEN 5-325 MG PO TABS: 2.0000 | ORAL_TABLET | ORAL | Status: DC | PRN

## 2016-11-29 MED ORDER — LACTATED RINGERS IV SOLN
INTRAVENOUS | Status: DC
  Administered 2016-11-29 – 2016-11-30 (×2): via INTRAVENOUS

## 2016-11-29 MED ORDER — MAGNESIUM SULFATE BOLUS VIA INFUSION
4.0000 g | Freq: Once | INTRAVENOUS | Status: AC
  Administered 2016-11-29: 4 g via INTRAVENOUS
  Filled 2016-11-29: qty 500

## 2016-11-29 MED ORDER — ONDANSETRON HCL 4 MG/2ML IJ SOLN
4.0000 mg | Freq: Four times a day (QID) | INTRAMUSCULAR | Status: DC | PRN
Start: 2016-11-29 — End: 2016-11-30

## 2016-11-29 MED ORDER — LABETALOL HCL 5 MG/ML IV SOLN
20.0000 mg | INTRAVENOUS | Status: DC | PRN
Start: 2016-11-29 — End: 2016-11-30
  Administered 2016-11-29: 20 mg via INTRAVENOUS

## 2016-11-29 MED ORDER — LABETALOL HCL 300 MG PO TABS
600.0000 mg | ORAL_TABLET | Freq: Three times a day (TID) | ORAL | Status: DC
  Filled 2016-11-29 (×3): qty 2

## 2016-11-29 MED ORDER — LABETALOL HCL 200 MG PO TABS
400.0000 mg | ORAL_TABLET | Freq: Three times a day (TID) | ORAL | Status: DC
  Administered 2016-11-29: 400 mg via ORAL
  Filled 2016-11-29: qty 2

## 2016-11-29 MED ORDER — OXYTOCIN 40 UNITS IN LACTATED RINGERS INFUSION - SIMPLE MED: 2.5000 [IU]/h | INTRAVENOUS | Status: DC

## 2016-11-29 MED ORDER — HYDRALAZINE HCL 20 MG/ML IJ SOLN
5.0000 mg | INTRAMUSCULAR | Status: AC | PRN
  Administered 2016-11-29: 5 mg via INTRAVENOUS
  Administered 2016-11-29: 10 mg via INTRAVENOUS
  Filled 2016-11-29: qty 1

## 2016-11-29 MED ORDER — FLEET ENEMA 7-19 GM/118ML RE ENEM: 1.0000 | ENEMA | Freq: Every day | RECTAL | Status: DC | PRN

## 2016-11-29 MED ORDER — MISOPROSTOL 25 MCG QUARTER TABLET
25.0000 ug | ORAL_TABLET | ORAL | Status: DC
  Administered 2016-11-29: 25 ug via VAGINAL
  Filled 2016-11-29 (×2): qty 1

## 2016-11-29 MED ORDER — LACTATED RINGERS IV SOLN: 500.0000 mL | INTRAVENOUS | Status: DC | PRN

## 2016-11-29 MED ORDER — LACTATED RINGERS IV SOLN: INTRAVENOUS | Status: DC

## 2016-11-29 MED ORDER — HYDRALAZINE HCL 20 MG/ML IJ SOLN
5.0000 mg | Freq: Once | INTRAMUSCULAR | Status: AC
  Administered 2016-11-29: 5 mg via INTRAVENOUS
  Filled 2016-11-29: qty 1

## 2016-11-29 MED ORDER — OXYTOCIN BOLUS FROM INFUSION: 500.0000 mL | Freq: Once | INTRAVENOUS | Status: DC

## 2016-11-29 MED ORDER — ACETAMINOPHEN 325 MG PO TABS: 650.0000 mg | ORAL_TABLET | ORAL | Status: DC | PRN

## 2016-11-29 MED ORDER — SOD CITRATE-CITRIC ACID 500-334 MG/5ML PO SOLN: 30.0000 mL | ORAL | Status: DC | PRN

## 2016-11-29 NOTE — Progress Notes (Signed)
Labor Progress Note  Patient seen and doing well. Audio only interpreter used on the status. Poor connection limited the conversation. Patient does reports she is having pain. She was offered pain medication. She did not seem to understand why we would offer this as she was expecting pain with having the baby. Advised we will try to keep her as comfortable as she wishes to be.   BP seems to have improved from severe range on  of labetolol TID.  Vitals:   11/29/16 2201 11/29/16 2231 11/29/16 2301 11/29/16 2331  BP: (!) 152/96 (!) 151/92 139/81 (!) 155/91  Pulse: 98 97 93 95  Resp: 16     Temp:      TempSrc:      SpO2:      Weight:      Height:       A/P:  Continue monitor BP Stadol PRN Continue cytotec Repeat labs ordered for 12 PM tomorrow.  Ernestina Penna, MD 11/29/16 [phone]

## 2016-11-29 NOTE — Progress Notes (Addendum)
Patient ID: Monique Moon, female   DOB: 03/07/1976, 41 y.o.   MRN: [id number]  FACULTY PRACTICE ANTEPARTUM(COMPREHENSIVE) NOTE  Monique Moon is a 41 y.o. [id number] at [redacted]w[redacted]d  who is admitted for managemtn of elevated BP in a chronic hypertensive patient.    Fetal presentation is unsure. Length of Stay:  2  Days  Date of admission:11/27/2016  Subjective: Pt denies headache or visual changes this am. She denies RUQ/epigastric pain, nausea or emesis Patient reports the fetal movement as active. Patient reports uterine contraction  activity as none. Patient reports  vaginal bleeding as none. Patient describes fluid per vagina as None.  Vitals:  Blood pressure (!) 176/91, pulse 95, temperature 98.1 F (36.7 C), temperature source Oral, resp. rate 18, height  (1.651 m), weight 190 lb 12 oz (86.5 kg), last menstrual period 04/02/2016, SpO2 97 %. Vitals:   11/29/16 0050 11/29/16 0100 11/29/16 0541 11/29/16 0616  BP: (!) 176/93 (!) 161/83  (!) 176/91  Pulse: (!) 107 (!) 106  95  Resp: 18     Temp: 98.1 F (36.7 C)     TempSrc: Oral     SpO2:      Weight:   190 lb 12 oz (86.5 kg)   Height:       Physical Examination:  General appearance - alert, well appearing, and in no distress Fundal Height:  size equals dates Pelvic Exam:  examination not indicated Cervical Exam: Not evaluated.  Extremities: extremities normal, atraumatic, no cyanosis or edema with DTRs 2+ bilaterally Membranes:intact  Fetal Monitoring:     Baseline 145, mod variability, +accels, no decels Toco: no contractions  Labs:  No results found for this or any previous visit (from the past 24 hour(s)).  Imaging Studies:      Medications:  Scheduled . docusate sodium  100 mg Oral Daily  . labetalol  400 mg Oral Q8H  . potassium chloride  20 mEq Oral BID  . prenatal multivitamin  1 tablet Oral Q1200  . sodium chloride flush  3 mL Intravenous Q12H   I have reviewed the patient's current  medications.  ASSESSMENT: [id number] [redacted]w[redacted]d Estimated Date of Delivery: 01/07/17  Patient Active Problem List   Diagnosis Date Noted  . Hypertension in pregnancy, antepartum 11/27/2016  . Supervision of high-risk pregnancy 08/16/2016  . Grand multipara 08/16/2016  . Advanced maternal age in multigravida, second trimester 08/16/2016  . Preexisting hypertension complicating pregnancy, antepartum 06/07/2016  . HTN (hypertension) 02/07/2016  . Language barrier 02/07/2016  . Obesity 02/07/2016    PLAN: Severe range BP noted this am Will increase labetalol to 400 mg TID Patient completed BMZ course on 3/29 Discussed continued inpatient observation and management plan with the use of [employer] interpreter All questions were answered Patient is very anxious regarding the possibility of a preterm delivery  Persephanie Laatsch 11/29/2016,7:36 AM

## 2016-11-29 NOTE — Progress Notes (Signed)
Monique Moon is a 41 y.o. female [phone] with IUP at [redacted]w[redacted]d presenting for SIPE. Pt states she has been having none contractions, associated with none vaginal bleeding for no hours..  Membranes are intact, with active fetal movement.   PNCare at 19 weeks.  Prenatal History/Complications:  Past Medical History: Past Medical History:  Diagnosis Date  . HTN (hypertension)   . Malaria     Past Surgical History: Past Surgical History:  Procedure Laterality Date  . NO PAST SURGERIES      Obstetrical History: OB History    Gravida Para Term Preterm AB Living   SAB TAB Ectopic Multiple Live Births           8       Social History: Social History   Social History  . Marital status: Married    Spouse name: N/A  . Number of children: 10  . Years of education: 0   Social History Main Topics  . Smoking status: Never Smoker  . Smokeless tobacco: Never Used  . Alcohol use No  . Drug use: No  . Sexual activity: Yes    Birth control/ protection: None   Other Topics Concern  . None   Social History Narrative  . None    Family History: History reviewed. No pertinent family history.  Allergies: No Known Allergies  Prescriptions Prior to Admission  Medication Sig Dispense Refill Last Dose  . aspirin EC 81 MG tablet Take 1 tablet (81 mg total) by mouth daily. 90 tablet 1 Past Month at Unknown time  . labetalol (NORMODYNE) 100 MG tablet Take 2 tablets (200 mg total) by mouth 2 (two) times daily. 60 tablet 2 11/13/2016 at Unknown time  . Prenatal Vit-Fe Fumarate-FA (PREPLUS) 27-1 MG TABS Take 1 tablet by mouth daily. 30 tablet 13 11/26/2016 at Unknown time        Review of Systems   Constitutional: Negative for fever and chills Eyes: Negative for visual disturbances Respiratory: Negative for shortness of breath, dyspnea Cardiovascular: Negative for chest pain or palpitations  Gastrointestinal: Negative for vomiting, diarrhea and constipation.  POSITIVE  for abdominal pain (contractions) Genitourinary: Negative for dysuria and urgency Musculoskeletal: Negative for back pain, joint pain, myalgias  Neurological: Negative for dizziness and headaches      Blood pressure (!) 173/99, pulse (!) 101, temperature 98.2 F (36.8 C), temperature source Oral, resp. rate 18, height  (1.651 m), weight 86.1 kg (189 lb 12 oz), last menstrual period 04/02/2016, SpO2 97 %. General appearance: alert, cooperative and no distress Lungs: clear to auscultation bilaterally Heart: regular rate and rhythm Abdomen: soft, non-tender; bowel sounds normal Extremities: Homans sign is negative, no sign of DVT DTR's 2+ Presentation: unsure Fetal monitoring  Baseline: 145 bpm Uterine activity  None     Prenatal labs: ABO, Rh: --/--/B POS, B POS (03/28 1130) Antibody: NEG (03/28 1130) Rubella: !Error! RPR: NON REAC (12/15 1108)  HBsAg: NEGATIVE (12/15 1108)  HIV: NONREACTIVE (12/15 1108)  GBS:   pending 1 hr Glucola 135 Genetic screening  declined Anatomy US normal  Prenatal Transfer Tool  Maternal Diabetes: No Genetic Screening: Declined Maternal Ultrasounds/Referrals: Normal Fetal Ultrasounds or other Referrals:  None Maternal Substance Abuse:  No Significant Maternal Medications:  Meds include: Other: labetelol Significant Maternal Lab Results: None     Results for orders placed or performed during the hospital encounter of 11/27/16 (from the past 24 hour(s))  CBC  Collection Time: 11/29/16 12:56 PM  Result Value Ref Range   WBC 13.1 (H) 4.0 - 10.5 K/uL   RBC 3.97 3.87 - 5.11 MIL/uL   Hemoglobin 11.3 (L) 12.0 - 15.0 g/dL   HCT 16.1 (L) 09.6 - 04.5 %   MCV 83.9 78.0 - 100.0 fL   MCH 28.5 26.0 - 34.0 pg   MCHC 33.9 30.0 - 36.0 g/dL   RDW 40.9 81.1 - 91.4 %   Platelets 133 (L) 150 - 400 K/uL  Comprehensive metabolic panel   Collection Time: 11/29/16 12:56 PM  Result Value Ref Range   Sodium 137 135 - 145 mmol/L   Potassium 3.4 (L)  3.5 - 5.1 mmol/L   Chloride 109 101 - 111 mmol/L   CO2 22 22 - 32 mmol/L   Glucose, Bld 115 (H) 65 - 99 mg/dL   BUN 6 6 - 20 mg/dL   Creatinine, Ser 7.82 (L) 0.44 - 1.00 mg/dL   Calcium 8.2 (L) 8.9 - 10.3 mg/dL   Total Protein 6.0 (L) 6.5 - 8.1 g/dL   Albumin 2.7 (L) 3.5 - 5.0 g/dL   AST 20 15 - 41 U/L   ALT 18 14 - 54 U/L   Alkaline Phosphatase 69 38 - 126 U/L   Total Bilirubin 0.6 0.3 - 1.2 mg/dL   GFR calc non Af Amer >60 >60 mL/min   GFR calc Af Amer >60 >60 mL/min   Anion gap 6 5 - 15    Assessment: Monique Moon is a 41 y.o. [id number] with an IUP at [redacted]w[redacted]d presenting for induction of labor due to severe range pressures and CHTN.   Plan: #Labor: expectant management #Pain:  Per request #FWB Cat 1 #ID: GBS: pending; will treat prophylactically #MOF:  breast #MOC: natural family planning/undecided #Circ: NA  Foley bulb placed by Dr. Macon Large; vaginal cytotec placed.   Charlesetta Garibaldi Kooistra CNM 11/29/2016, 2:23 PM note

## 2016-11-29 NOTE — Progress Notes (Signed)
Faculty Practice OB/GYN Attending Note   Subjective:  Called to evaluate patient with persistent severe range BP despite increasing doses of Labetalol. Denies HA, visual changes, abdominal pain.  [employer] video Swahili interpreter used for encounter. FHR reassuring, no contractions, no LOF or vaginal bleeding. Good FM.      Objective:  Patient Vitals for the past 24 hrs:  BP Temp Temp src Pulse Resp SpO2 Weight  11/29/16 1407 (!) 184/99 - - (!) 103 18 - -  11/29/16 1200 (!) 173/99 98.2 F (36.8 C) Oral (!) 101 18 97 % -  11/29/16 1147 (!) 166/89 - - 100 18 - -  11/29/16 1100 - - - - - - 189 lb 12 oz (86.1 kg)  11/29/16 0909 (!) 147/81 - - (!) 104 18 - -  11/29/16 0800 (!) 171/89 98.3 F (36.8 C) Oral (!) 101 18 96 % -  11/29/16 0732 (!) 175/112 - - 100 - - -  11/29/16 0618 (!) 176/91 - - (!) 102 - - -  11/29/16 0617 (!) 178/104 - - 98 - - -  11/29/16 0616 (!) 176/91 - - 95 - - -  11/29/16 0541 - - - - - - 190 lb 12 oz (86.5 kg)  11/29/16 0100 (!) 161/83 - - (!) 106 - - -  11/29/16 0050 (!) 176/93 98.1 F (36.7 C) Oral (!) 107 18 - -  11/29/16 0049 - - - (!) 105 - 97 % -  11/28/16 2002 (!) 152/86 98.1 F (36.7 C) Oral 98 18 - -  11/28/16 1539 - - - 100 - - -  11/28/16 1538 (!) 156/84 98.3 F (36.8 C) Oral (!) 101 16 96 % -   FHT  Baseline 145 bpm, moderate variability, +accelerations, no decelerations Toco: no contractions Gen: NAD HENT: Normocephalic, atraumatic Lungs: Normal respiratory effort Heart: Regular rate noted Abdomen: NT gravid fundus, soft Cervix: Deferred Ext: 2+ DTRs, no clonus  Assessment & Plan:  41 y.o. [id number] at [redacted]w[redacted]d admitted for worsening BP, history of CHTN.  Has been getting persistent severe range BP.  She is s/p betamethasone x 2 doses (3/28 and 3/29).  Given severe range BP despite increasing Labetalol doses, delivery recommended. Will treat with hydralazine as per protocol. Patient agreed to this plan. Neonatology consulted.  Will transfer to L&D  to start IOL, start magnesium sulfate for eclampsia prophylaxis, and treat BP with Hydralazine as per protocol.  Will continue close observation.  Jaynie Collins, MD, FACOG Attending Obstetrician & Gynecologist Faculty Practice, [facility]

## 2016-11-29 NOTE — Consult Note (Addendum)
[place]  Consultation Service: Neonatology   Osborne Oman, MD  has asked for consultation on Monique Moon with MRN# [id number] regarding the care of a premature infant at 42 3/[redacted] weeks EGA. Thank you for inviting [place] to see this patient.   Reason for consult:  Explain the possible complications, the prognosis, and the care of a premature infant at [redacted] weeks EGA.   Chief complaint: 41yo female with a 34 3/7 week IUP with an estimated weight of 1947 grams.   My key findings of this patient's HPI are:  I have reviewed the patient's chart and have met with her. The salient information is as follows:   41 y.o. [id number] at 26w3dadmitted for severe HTN and started on Labetalol with hydralazine prn.  Pregnancy complicated by multigravida, language barrier, obesity, and AMA.  Now receiving IV magnesium and PCN and s/p BTMZ.  No PTL or PROM.   Prenatal labs:  ABO, Rh: --/--/B POS, B POS (03/28 1130) Antibody: NEG (03/28 1130) Rubella: Immune RPR: NON REAC (12/15 1108)  HBsAg: NEGATIVE (12/15 1108)  HIV: NONREACTIVE (12/15 1108)  GBS:   pending 1 hr Glucola 135 Genetic screening  declined Anatomy [place]   Prenatal care:   good Pregnancy complications:  chronic HTN Maternal antibiotics: This patient's mother is not on file. Maternal Steroids: yes   Most recent dose:  3/29 #2    My recommendations for this patient and my actions included:   1. In the presence of the mother and two L&D nurses as well as Peds Resident, I spent 25 minutes discussing via interpretor line the possible complications and outcomes of prematurity at this gestational age. I discussed the potential need for resuscitation at birth, mechanical ventilation and surfactant administration for respiratory distress, IV fluids pending establishment of enteral feeds (encouraged breast milk feeding to which she planned to do), antibiotics for possible sepsis, temperature support, and monitoring. I also  briefly discussed the potential risk of complications such as intracranial hemorrhage, retinopathy, hearing deficit, and chronic lung disease. I also discussed the potential length of stay in the neonatal intensive care unit for about 6 weeks. I discussed this with parent. She expressed an understanding of the risks and complications of prematurity but asked very questions demonstrating such.   2. I also discussed the expected survival of an infant born at 391 weeksEGA, which is near 97%. We did not discuss neurological complications due to a lack of perceived understanding.     3. I informed her that the NICU team would be present at the delivery. She agreed that all appropriate medical measures could be taken to resuscitate her infant at the delivery.   Final Impression:  41yo female with a 345week IUP who is threatening to deliver and who now understands the possible complications and prognosis of her infant.    ______________________________________________________________________  Thank you for asking [place] participate in the care of this patient. Please do not hesitate to contact [email or web address] if you are aware of any further ways we can be of assistance.   Sincerely,  DMonia Sabal EKatherina Mires MD Neonatologist  I spent ~40 minutes in consultation time, of which 25 minutes was spent in direct face to face counseling.

## 2016-11-30 ENCOUNTER — Inpatient Hospital Stay (HOSPITAL_COMMUNITY): Payer: BLUE CROSS/BLUE SHIELD | Admitting: Anesthesiology

## 2016-11-30 ENCOUNTER — Encounter (HOSPITAL_COMMUNITY): Payer: Self-pay

## 2016-11-30 DIAGNOSIS — O10013 Pre-existing essential hypertension complicating pregnancy, third trimester: Secondary | ICD-10-CM

## 2016-11-30 DIAGNOSIS — O0993 Supervision of high risk pregnancy, unspecified, third trimester: Secondary | ICD-10-CM

## 2016-11-30 LAB — CBC
HEMATOCRIT: 36 % (ref 36.0–46.0)
HEMOGLOBIN: 12 g/dL (ref 12.0–15.0)
MCH: 28.2 pg (ref 26.0–34.0)
MCHC: 33.3 g/dL (ref 30.0–36.0)
MCV: 84.5 fL (ref 78.0–100.0)
Platelets: 127 10*3/uL — ABNORMAL LOW (ref 150–400)
RBC: 4.26 MIL/uL (ref 3.87–5.11)
RDW: 13.9 % (ref 11.5–15.5)
WBC: 15 10*3/uL — ABNORMAL HIGH (ref 4.0–10.5)

## 2016-11-30 LAB — RPR: RPR: NONREACTIVE

## 2016-11-30 MED ORDER — WITCH HAZEL-GLYCERIN EX PADS: 1.0000 "application " | MEDICATED_PAD | CUTANEOUS | Status: DC | PRN

## 2016-11-30 MED ORDER — OXYTOCIN 40 UNITS IN LACTATED RINGERS INFUSION - SIMPLE MED
1.0000 m[IU]/min | INTRAVENOUS | Status: DC
  Administered 2016-11-30: 2 m[IU]/min via INTRAVENOUS
  Filled 2016-11-30: qty 1000

## 2016-11-30 MED ORDER — SENNOSIDES-DOCUSATE SODIUM 8.6-50 MG PO TABS
2.0000 | ORAL_TABLET | ORAL | Status: DC
  Administered 2016-12-01 – 2016-12-03 (×4): 2 via ORAL
  Filled 2016-11-30 (×4): qty 2

## 2016-11-30 MED ORDER — DIBUCAINE 1 % RE OINT: 1.0000 "application " | TOPICAL_OINTMENT | RECTAL | Status: DC | PRN

## 2016-11-30 MED ORDER — PHENYLEPHRINE 40 MCG/ML (10ML) SYRINGE FOR IV PUSH (FOR BLOOD PRESSURE SUPPORT)
80.0000 ug | PREFILLED_SYRINGE | INTRAVENOUS | Status: DC | PRN
  Filled 2016-11-30: qty 5

## 2016-11-30 MED ORDER — EPHEDRINE 5 MG/ML INJ
10.0000 mg | INTRAVENOUS | Status: DC | PRN
  Filled 2016-11-30: qty 2

## 2016-11-30 MED ORDER — ACETAMINOPHEN 325 MG PO TABS: 650.0000 mg | ORAL_TABLET | ORAL | Status: DC | PRN

## 2016-11-30 MED ORDER — FENTANYL 2.5 MCG/ML BUPIVACAINE 1/10 % EPIDURAL INFUSION (WH - ANES)
INTRAMUSCULAR | Status: AC
  Filled 2016-11-30: qty 100

## 2016-11-30 MED ORDER — ONDANSETRON HCL 4 MG/2ML IJ SOLN: 4.0000 mg | INTRAMUSCULAR | Status: DC | PRN

## 2016-11-30 MED ORDER — TERBUTALINE SULFATE 1 MG/ML IJ SOLN: 0.2500 mg | Freq: Once | INTRAMUSCULAR | Status: DC | PRN

## 2016-11-30 MED ORDER — LACTATED RINGERS IV SOLN
500.0000 mL | Freq: Once | INTRAVENOUS | Status: AC
  Administered 2016-11-30: 500 mL via INTRAVENOUS

## 2016-11-30 MED ORDER — ONDANSETRON HCL 4 MG PO TABS: 4.0000 mg | ORAL_TABLET | ORAL | Status: DC | PRN

## 2016-11-30 MED ORDER — ENALAPRIL MALEATE 20 MG PO TABS
20.0000 mg | ORAL_TABLET | Freq: Every day | ORAL | Status: DC
  Administered 2016-11-30 – 2016-12-01 (×2): 20 mg via ORAL
  Filled 2016-11-30 (×3): qty 1

## 2016-11-30 MED ORDER — DIPHENHYDRAMINE HCL 50 MG/ML IJ SOLN: 12.5000 mg | INTRAMUSCULAR | Status: DC | PRN

## 2016-11-30 MED ORDER — COCONUT OIL OIL: 1.0000 "application " | TOPICAL_OIL | Status: DC | PRN

## 2016-11-30 MED ORDER — DIPHENHYDRAMINE HCL 25 MG PO CAPS: 25.0000 mg | ORAL_CAPSULE | Freq: Four times a day (QID) | ORAL | Status: DC | PRN

## 2016-11-30 MED ORDER — IBUPROFEN 600 MG PO TABS
600.0000 mg | ORAL_TABLET | Freq: Four times a day (QID) | ORAL | Status: DC
  Administered 2016-11-30 – 2016-12-01 (×6): 600 mg via ORAL
  Filled 2016-11-30 (×6): qty 1

## 2016-11-30 MED ORDER — LIDOCAINE HCL (PF) 1 % IJ SOLN
INTRAMUSCULAR | Status: DC | PRN
  Administered 2016-11-30: 4 mL
  Administered 2016-11-30: 14 mL via EPIDURAL
  Administered 2016-11-30: 6 mL

## 2016-11-30 MED ORDER — SIMETHICONE 80 MG PO CHEW
80.0000 mg | CHEWABLE_TABLET | ORAL | Status: DC | PRN
Start: 2016-11-30 — End: 2016-12-04

## 2016-11-30 MED ORDER — ZOLPIDEM TARTRATE 5 MG PO TABS: 5.0000 mg | ORAL_TABLET | Freq: Every evening | ORAL | Status: DC | PRN

## 2016-11-30 MED ORDER — FENTANYL 2.5 MCG/ML BUPIVACAINE 1/10 % EPIDURAL INFUSION (WH - ANES)
14.0000 mL/h | INTRAMUSCULAR | Status: DC | PRN
  Administered 2016-11-30: 14 mL/h via EPIDURAL

## 2016-11-30 MED ORDER — MAGNESIUM SULFATE 40 G IN LACTATED RINGERS - SIMPLE
2.0000 g/h | INTRAVENOUS | Status: AC
  Administered 2016-11-30 – 2016-12-01 (×2): 2 g/h via INTRAVENOUS
  Filled 2016-11-30 (×2): qty 500

## 2016-11-30 MED ORDER — LACTATED RINGERS IV SOLN
INTRAVENOUS | Status: DC
  Administered 2016-11-30 – 2016-12-01 (×4): via INTRAVENOUS

## 2016-11-30 MED ORDER — TETANUS-DIPHTH-ACELL PERTUSSIS 5-2.5-18.5 LF-MCG/0.5 IM SUSP
0.5000 mL | Freq: Once | INTRAMUSCULAR | Status: AC
  Administered 2016-12-02: 0.5 mL via INTRAMUSCULAR

## 2016-11-30 MED ORDER — HYDRALAZINE HCL 20 MG/ML IJ SOLN
10.0000 mg | Freq: Once | INTRAMUSCULAR | Status: AC
  Administered 2016-11-30: 10 mg via INTRAVENOUS

## 2016-11-30 MED ORDER — PHENYLEPHRINE 40 MCG/ML (10ML) SYRINGE FOR IV PUSH (FOR BLOOD PRESSURE SUPPORT)
PREFILLED_SYRINGE | INTRAVENOUS | Status: AC
  Filled 2016-11-30: qty 20

## 2016-11-30 MED ORDER — PRENATAL MULTIVITAMIN CH
1.0000 | ORAL_TABLET | Freq: Every day | ORAL | Status: DC
  Administered 2016-11-30 – 2016-12-04 (×5): 1 via ORAL
  Filled 2016-11-30 (×5): qty 1

## 2016-11-30 MED ORDER — HYDRALAZINE HCL 20 MG/ML IJ SOLN
10.0000 mg | INTRAMUSCULAR | Status: DC | PRN
  Administered 2016-12-01: 10 mg via INTRAVENOUS
  Filled 2016-11-30 (×2): qty 1

## 2016-11-30 MED ORDER — BENZOCAINE-MENTHOL 20-0.5 % EX AERO: 1.0000 "application " | INHALATION_SPRAY | CUTANEOUS | Status: DC | PRN

## 2016-11-30 NOTE — Progress Notes (Signed)
Patient seen. Doing well. Foley out. Cervix 4/80/-3. Bulgin memebranes. Unable to palpate presenting part. Bedside US reveals vertex. Ok to start pitocin. Patient plans for epidural.   Ernestina Penna, MD 11/30/16 [phone]

## 2016-11-30 NOTE — Anesthesia Procedure Notes (Signed)
Epidural Patient location during procedure: OB  Staffing Anesthesiologist: Avid Guillette  Preanesthetic Checklist Completed: patient identified, site marked, surgical consent, pre-op evaluation, timeout performed, IV checked, risks and benefits discussed and monitors and equipment checked  Epidural Patient position: sitting Prep: site prepped and draped and DuraPrep Patient monitoring: continuous pulse ox and blood pressure Approach: midline Location: L3-L4 Injection technique: LOR air  Needle:  Needle type: Tuohy  Needle gauge: 17 G Needle length: 9 cm and 9 Needle insertion depth: 6 cm Catheter type: closed end flexible Catheter size: 19 Gauge Catheter at skin depth: 12 cm Test dose: negative  Assessment Events: blood not aspirated, injection not painful, no injection resistance, negative IV test and no paresthesia  Additional Notes Dosing of Epidural:  1st dose, through catheter .............................................  Xylocaine 40 mg  2nd dose, through catheter, after waiting 3 minutes.........Xylocaine 60 mg    As each dose occurred, patient was free of IV sx; and patient exhibited no evidence of SA injection.  Patient is more comfortable after epidural dosed. Please see RN's note for documentation of vital signs,and FHR which are stable.  Patient reminded not to try to ambulate with numb legs, and that an RN must be present when she attempts to get up.        

## 2016-11-30 NOTE — Anesthesia Postprocedure Evaluation (Signed)
Anesthesia Post Note  Patient: Monique Moon  Procedure(s) Performed: * No procedures listed *  Patient location during evaluation: Mother Baby Anesthesia Type: Epidural Level of consciousness: awake and alert Pain management: pain level controlled Vital Signs Assessment: post-procedure vital signs reviewed and stable Respiratory status: spontaneous breathing, nonlabored ventilation, respiratory function stable and patient connected to nasal cannula oxygen Cardiovascular status: stable Postop Assessment: no headache, no backache and epidural receding Anesthetic complications: no        Last Vitals:  Vitals:   11/30/16 1410 11/30/16 1510  BP: 128/74   Pulse: 83   Resp: 20 20  Temp: 36.7 C     Last Pain:  Vitals:   11/30/16 1410  TempSrc: Oral  PainSc: 0-No pain   Pain Goal: Patients Stated Pain Goal: 2 (11/28/16 2002)               Rica Records

## 2016-11-30 NOTE — Anesthesia Preprocedure Evaluation (Signed)
Anesthesia Evaluation  Patient identified by MRN, date of birth, ID band Patient awake    Reviewed: Allergy & Precautions, H&P , Patient's Chart, lab work & pertinent test results  Airway Mallampati: II  TM Distance: >3 FB Neck ROM: full    Dental  (+) Teeth Intact   Pulmonary    breath sounds clear to auscultation       Cardiovascular hypertension,  Rhythm:regular Rate:Normal     Neuro/Psych    GI/Hepatic   Endo/Other    Renal/GU      Musculoskeletal   Abdominal   Peds  Hematology   Anesthesia Other Findings   PIH; stable plts    Reproductive/Obstetrics (+) Pregnancy                             Anesthesia Physical Anesthesia Plan  ASA: III  Anesthesia Plan: Epidural   Post-op Pain Management:    Induction:   Airway Management Planned:   Additional Equipment:   Intra-op Plan:   Post-operative Plan:   Informed Consent: I have reviewed the patients History and Physical, chart, labs and discussed the procedure including the risks, benefits and alternatives for the proposed anesthesia with the patient or authorized representative who has indicated his/her understanding and acceptance.   Dental Advisory Given  Plan Discussed with:   Anesthesia Plan Comments: (Labs checked- platelets confirmed with RN in room. Fetal heart tracing, per RN, reported to be stable enough for sitting procedure. Discussed epidural, and patient consents to the procedure:  included risk of possible headache,backache, failed block, allergic reaction, and nerve injury. This patient was asked if she had any questions or concerns before the procedure started.)        Anesthesia Quick Evaluation

## 2016-11-30 NOTE — Lactation Note (Signed)
This note was copied from a baby's chart. Lactation Consultation Note  Patient Name: Monique Moon ZOXWR'U Date: 11/30/2016 Reason for consult: Initial assessment;NICU baby Infant is 8 hours old & mom is seen by lactation for initial assessment. Baby is in NICU. Baby was born at [redacted]w[redacted]d and weighed 4 lbs 9 oz at birth. Spoke with RN before going into the room who stated that mom pumped with DEBP ~1pm. Mom speaks Swahili so [employer] was used. Mom reported she pumped but did not have any milk so would pump again when she had milk. Discussed importance of pumping at least 8x in 24hrs and how she may not see milk now but it will help her milk volume increase. Mom stated she BF her other children for ~2 years but is not used to pumping. Showed mom how to hand express, no milk was seen. Encouraged mom to pump for ~15 mins and then do hand expression for ~5 mins. Put stickers on the clock in the room to remind her when to pump. Showed mom how to clean pump parts. Gave mom the NICU book & showed mom lactation number for questions once home. Mom stated she does not have WIC and is unsure whether she would qualify. Pt has [employer] so suggested mom have someone help her call on Monday to see if they will send her a pump. Also provided mom with WIC's address and phone number to see if she qualifies and could get a pump from them. Mom reports no questions at this time. Encouraged mom to ask for help as needed.  Maternal Data Has patient been taught Hand Expression?: Yes Does the patient have breastfeeding experience prior to this delivery?: Yes  Feeding    LATCH Score/Interventions                      Lactation Tools Discussed/Used     Consult Status Consult Status: Follow-up Date: 12/01/16 Follow-up type: In-patient    Oneal Grout 11/30/2016, 4:35 PM

## 2016-12-01 LAB — CULTURE, BETA STREP (GROUP B ONLY)

## 2016-12-01 LAB — CBC
HCT: 32 % — ABNORMAL LOW (ref 36.0–46.0)
Hemoglobin: 10.7 g/dL — ABNORMAL LOW (ref 12.0–15.0)
MCH: 28.4 pg (ref 26.0–34.0)
MCHC: 33.4 g/dL (ref 30.0–36.0)
MCV: 84.9 fL (ref 78.0–100.0)
Platelets: 134 10*3/uL — ABNORMAL LOW (ref 150–400)
RBC: 3.77 MIL/uL — ABNORMAL LOW (ref 3.87–5.11)
RDW: 13.8 % (ref 11.5–15.5)
WBC: 8.6 10*3/uL (ref 4.0–10.5)

## 2016-12-01 LAB — COMPREHENSIVE METABOLIC PANEL
ALT: 26 U/L (ref 14–54)
AST: 27 U/L (ref 15–41)
Albumin: 2.4 g/dL — ABNORMAL LOW (ref 3.5–5.0)
Alkaline Phosphatase: 61 U/L (ref 38–126)
Anion gap: 3 — ABNORMAL LOW (ref 5–15)
BUN: 6 mg/dL (ref 6–20)
CO2: 27 mmol/L (ref 22–32)
Calcium: 6.8 mg/dL — ABNORMAL LOW (ref 8.9–10.3)
Chloride: 104 mmol/L (ref 101–111)
Creatinine, Ser: 0.4 mg/dL — ABNORMAL LOW (ref 0.44–1.00)
GFR calc Af Amer: >60 mL/min (ref >60)
GFR calc non Af Amer: >60 mL/min (ref >60)
Glucose, Bld: 89 mg/dL (ref 65–99)
Potassium: 3.6 mmol/L (ref 3.5–5.1)
Sodium: 134 mmol/L — ABNORMAL LOW (ref 135–145)
Total Bilirubin: 0.5 mg/dL (ref 0.3–1.2)
Total Protein: 4.9 g/dL — ABNORMAL LOW (ref 6.5–8.1)

## 2016-12-01 MED ORDER — LABETALOL HCL 200 MG PO TABS
400.0000 mg | ORAL_TABLET | Freq: Once | ORAL | Status: AC
  Administered 2016-12-01: 400 mg via ORAL
  Filled 2016-12-01: qty 2

## 2016-12-01 MED ORDER — ENALAPRIL MALEATE 20 MG PO TABS
20.0000 mg | ORAL_TABLET | Freq: Once | ORAL | Status: AC
  Administered 2016-12-01: 20 mg via ORAL
  Filled 2016-12-01: qty 1

## 2016-12-01 MED ORDER — ACETAMINOPHEN 325 MG PO TABS
650.0000 mg | ORAL_TABLET | ORAL | Status: DC
  Administered 2016-12-01 – 2016-12-04 (×14): 650 mg via ORAL
  Filled 2016-12-01 (×14): qty 2

## 2016-12-01 MED ORDER — HYDRALAZINE HCL 20 MG/ML IJ SOLN
10.0000 mg | Freq: Once | INTRAMUSCULAR | Status: AC
  Administered 2016-12-01: 10 mg via INTRAVENOUS

## 2016-12-01 NOTE — Progress Notes (Signed)
Patients blood pressure was 178/98 then a reading 30 minutes later was 178/88. Notified MD and orders received for one time dose of Vasotec 20 mg and to give the PRN Hydralazine  IV. Explained to patient via translator about the medications and her blood pressure. Pt verbalized understanding. Will continue to monitor.

## 2016-12-01 NOTE — Progress Notes (Signed)
Post Partum Day 1 s/p IOL at [redacted]w[redacted]d due to severe range, uncontrolled CHTN Subjective: No complaints and tolerating PO . Denies any symptoms. Baby in NICU. Swahili phone interpreter used.  Magnesium sulfate was just turned off.   Objective: Blood pressure (!) 166/92, pulse 83, temperature 98.1 F (36.7 C), temperature source Oral, resp. rate 20, height  (1.651 m), weight 189 lb 12 oz (86.1 kg), last menstrual period 04/02/2016, SpO2 100 %, unknown if currently breastfeeding. Patient Vitals for the past 24 hrs:  BP Temp Temp src Pulse Resp SpO2  12/01/16 0730 (!) 166/92 98.1 F (36.7 C) Oral 83 20 100 %  12/01/16 0530 (!) 152/99 - - 86 18 -  12/01/16 0130 (!) 147/88 - - 80 16 -  11/30/16 2135 132/80 98.1 F (36.7 C) Oral 89 18 -  11/30/16 1900 - - - - 18 -  11/30/16 1800 - - - - 20 -  11/30/16 1705 - - - - 18 -  11/30/16 1600 - - - - 20 -  11/30/16 1510 - - - - 20 -  11/30/16 1410 128/74 98.1 F (36.7 C) Oral 83 20 98 %  11/30/16 1300 - - - - 19 -  11/30/16 1200 - - - - 18 -  11/30/16 1100 - - - - 20 -  11/30/16 1000 (!) 152/87 98 F (36.7 C) Oral 88 18 98 %  11/30/16 0856 (!) 144/90 98.2 F (36.8 C) Oral 93 18 99 %  11/30/16 0800 (!) 156/95 - - 90 18 -  11/30/16 0745 (!) 159/98 - - 95 19 -    Physical Exam:  General: alert and no distress Lochia: appropriate Uterine Fundus: firm DVT Evaluation: No evidence of DVT seen on physical exam. Negative Homan's sign.  Results for orders placed or performed during the hospital encounter of 11/27/16 (from the past 24 hour(s))  CBC     Status: Abnormal   Collection Time: 11/30/16  8:27 AM  Result Value Ref Range   WBC 15.0 (H) 4.0 - 10.5 K/uL   RBC 4.26 3.87 - 5.11 MIL/uL   Hemoglobin 12.0 12.0 - 15.0 g/dL   HCT 16.1 09.6 - 04.5 %   MCV 84.5 78.0 - 100.0 fL   MCH 28.2 26.0 - 34.0 pg   MCHC 33.3 30.0 - 36.0 g/dL   RDW 40.9 81.1 - 91.4 %   Platelets 127 (L) 150 - 400 K/uL  CBC     Status: Abnormal   Collection Time:  12/01/16  5:20 AM  Result Value Ref Range   WBC 8.6 4.0 - 10.5 K/uL   RBC 3.77 (L) 3.87 - 5.11 MIL/uL   Hemoglobin 10.7 (L) 12.0 - 15.0 g/dL   HCT 78.2 (L) 95.6 - 21.3 %   MCV 84.9 78.0 - 100.0 fL   MCH 28.4 26.0 - 34.0 pg   MCHC 33.4 30.0 - 36.0 g/dL   RDW 08.6 57.8 - 46.9 %   Platelets 134 (L) 150 - 400 K/uL  Comprehensive metabolic panel     Status: Abnormal   Collection Time: 12/01/16  5:20 AM  Result Value Ref Range   Sodium 134 (L) 135 - 145 mmol/L   Potassium 3.6 3.5 - 5.1 mmol/L   Chloride 104 101 - 111 mmol/L   CO2 27 22 - 32 mmol/L   Glucose, Bld 89 65 - 99 mg/dL   BUN 6 6 - 20 mg/dL   Creatinine, Ser 6.29 (L) 0.44 - 1.00 mg/dL  Calcium 6.8 (L) 8.9 - 10.3 mg/dL   Total Protein 4.9 (L) 6.5 - 8.1 g/dL   Albumin 2.4 (L) 3.5 - 5.0 g/dL   AST 27 15 - 41 U/L   ALT 26 14 - 54 U/L   Alkaline Phosphatase 61 38 - 126 U/L   Total Bilirubin 0.5 0.3 - 1.2 mg/dL   GFR calc non Af Amer >60 >60 mL/min   GFR calc Af Amer >60 >60 mL/min   Anion gap 3 (L) 5 - 15    Assessment/Plan: Will continue Enalapril for now, may need to add Norvasc if BP continues to be high Patient desires Depo Provera for contraception Breast and bottle feeding. Hope to stabilize BP and discharge tomorrow Routine postpartum care   LOS: 4 days   Jaynie Collins, MD 12/01/2016, 7:40 AM

## 2016-12-01 NOTE — Lactation Note (Signed)
This note was copied from a baby's chart. Lactation Consultation Note  Patient Name: Monique Moon ZOXWR'U Date: 12/01/2016 Reason for consult: Follow-up assessment;NICU baby;Infant < 6lbs;Late preterm infant   Follow up with mom of 31 hour old LPT NICU infant. Spoke with mom with assistance of [facility] interpreter [place] # [phone].   Mom just returned from NICU and has been BF infant. Mom reports pumping is going well. She has obtained EBM this morning and has been pumping every 3 hours on Initiate phase with DEBP, she has taken to infant in NICU. Per conversation with Delfino Lovett in NICU earlier today that infant spitting with formula and wanted to find out if mom agreeable to donor milk. Mom declined use of Donor Milk as she reports she usually has plenty of milk . She was made aware that infant is spitting on formula.  Mom reports she prefers to pump as she is not getting much with hand expression. Enc mom to pump for 15 minutes and follow with hand expression. Mom asked how often she can BF infant, enc her to ask NICU nurse. Mom is to return to NICU at 5 pm. Reported mom's wishes to Delfino Lovett, [employer] fundraiser.    Maternal Data Formula Feeding for Exclusion: No Has patient been taught Hand Expression?: Yes  Feeding Feeding Type: Breast Fed  LATCH Score/Interventions Latch: Grasps breast easily, tongue down, lips flanged, rhythmical sucking.  Audible Swallowing: Spontaneous and intermittent  Type of Nipple: Everted at rest and after stimulation  Comfort (Breast/Nipple): Soft / non-tender     Hold (Positioning): No assistance needed to correctly position infant at breast.  LATCH Score: 10  Lactation Tools Discussed/Used Pump Review: Setup, frequency, and cleaning   Consult Status Consult Status: Follow-up Date: 12/02/16 Follow-up type: In-patient    Silas Flood Hice 12/01/2016, 2:27 PM

## 2016-12-01 NOTE — Progress Notes (Signed)
Pt taken to NICU by RN to visit infant.

## 2016-12-02 MED ORDER — HYDRALAZINE HCL 20 MG/ML IJ SOLN
5.0000 mg | INTRAMUSCULAR | Status: AC | PRN
  Administered 2016-12-02: 5 mg via INTRAVENOUS
  Administered 2016-12-03: 10 mg via INTRAVENOUS
  Filled 2016-12-02 (×2): qty 1

## 2016-12-02 MED ORDER — LABETALOL HCL 5 MG/ML IV SOLN
20.0000 mg | INTRAVENOUS | Status: AC | PRN
  Administered 2016-12-02: 20 mg via INTRAVENOUS
  Administered 2016-12-02: 40 mg via INTRAVENOUS
  Filled 2016-12-02: qty 4
  Filled 2016-12-02: qty 8

## 2016-12-02 MED ORDER — ENALAPRIL MALEATE 20 MG PO TABS
40.0000 mg | ORAL_TABLET | Freq: Every day | ORAL | Status: DC
  Administered 2016-12-02 – 2016-12-04 (×3): 40 mg via ORAL
  Filled 2016-12-02 (×4): qty 2

## 2016-12-02 MED ORDER — AMLODIPINE BESYLATE 10 MG PO TABS
10.0000 mg | ORAL_TABLET | Freq: Every day | ORAL | Status: DC
  Administered 2016-12-02 – 2016-12-04 (×4): 10 mg via ORAL
  Filled 2016-12-02 (×4): qty 1

## 2016-12-02 NOTE — Progress Notes (Addendum)
Post Partum Day 2 Subjective:  THE PAtient continues to have elevated pressures, and headaches associated with the elevated pressure. Thru the evening hours she required the addition of Norvasc, the doubling of the Enalapril dose, and 2 doses of IV Labetalol to improve pessures to current levels. Currently the headaches have resolved  Objective: Blood pressure (!) 147/85, pulse 85, temperature 98.8 F (37.1 C), temperature source Oral, resp. rate 18, height  (1.651 m), weight 86.1 kg (189 lb 12 oz), last menstrual period 04/02/2016, SpO2 98 %, unknown if currently breastfeeding. Temp:  [98.1 F (36.7 C)-98.8 F (37.1 C)] 98.8 F (37.1 C) (04/01 2020) Pulse Rate:  [76-103] 85 (04/02 0607) Resp:  [18-20] 18 (04/01 2020) BP: (147-178)/(78-105) 147/85 (04/02 0607) SpO2:  [98 %-100 %] 98 % (04/01 2020)  CBC    Component Value Date/Time   WBC 8.6 12/01/2016 0520   RBC 3.77 (L) 12/01/2016 0520   HGB 10.7 (L) 12/01/2016 0520   HCT 32.0 (L) 12/01/2016 0520   PLT 134 (L) 12/01/2016 0520   MCV 84.9 12/01/2016 0520   MCH 28.4 12/01/2016 0520   MCHC 33.4 12/01/2016 0520   RDW 13.8 12/01/2016 0520   LYMPHSABS 1.5 11/27/2016 1123   MONOABS 0.4 11/27/2016 1123   EOSABS 0.1 11/27/2016 1123   BASOSABS 0.0 11/27/2016 1123   CMP     Component Value Date/Time   NA 134 (L) 12/01/2016 0520   K 3.6 12/01/2016 0520   CL 104 12/01/2016 0520   CO2 27 12/01/2016 0520   GLUCOSE 89 12/01/2016 0520   BUN 6 12/01/2016 0520   CREATININE 0.40 (L) 12/01/2016 0520   CREATININE 0.47 (L) 08/16/2016 1108   CALCIUM 6.8 (L) 12/01/2016 0520   PROT 4.9 (L) 12/01/2016 0520   ALBUMIN 2.4 (L) 12/01/2016 0520   AST 27 12/01/2016 0520   ALT 26 12/01/2016 0520   ALKPHOS 61 12/01/2016 0520   BILITOT 0.5 12/01/2016 0520   GFRNONAA >60 12/01/2016 0520   GFRAA >60 12/01/2016 0520    Physical Exam:  General: alert, cooperative and no distress Lochia: appropriate Uterine Fundus:  Incision:  DVT  Evaluation: No evidence of DVT seen on physical exam.   Recent Labs  11/30/16 0827 12/01/16 0520  HGB 12.0 10.7*  HCT 36.0 32.0*    Assessment/Plan: Plan for discharge tomorrow and Breastfeeding, bottle supplement, Will need one more day to confirm pressures are controlled. Current doses of BP [id number] 10 mg daily                                           Enalapril 40 mg daily  LOS: 5 days   Shanyn Preisler V 12/02/2016, 7:16 AM

## 2016-12-02 NOTE — Progress Notes (Signed)
Talked with patient about pumping and visiting NICU.  I offered to escort her down.  We used the interpreter.

## 2016-12-02 NOTE — Lactation Note (Signed)
This note was copied from a baby's chart. Lactation Consultation Note  RN reports that mother is rarely pumping.  She desires to BF but the baby is being gavage fed according to the flowsheets. Explained to her that it was important to express her milk to give to the baby and also to support her milk supply. Assisted her with pumping and lubricated the flanges because mom was reporting pain with pumping.  She reported no pain at this session and expressed about 2 ml. Stressed importance of her milk for the baby and advised her to take her milk to the baby.  Understanding verbalized. Mom does not have WIC or a breast pump at home and does not have money to obtain one.  Showed her how to use the piston as a double manual breast pump.  Follow-up tomorrow.  Patient Name: Monique Moon [id number] Date: 12/02/2016 Reason for consult: Follow-up assessment   Maternal Data    Feeding Feeding Type: Formula Length of feed: 30 min  LATCH Score/Interventions                      Lactation Tools Discussed/Used     Consult Status Consult Status: Follow-up Date: 12/03/16 Follow-up type: In-patient    Soyla Dryer 12/02/2016, 12:48 PM

## 2016-12-02 NOTE — Progress Notes (Signed)
Rounded with Dr Emelda Fear, phone interpreter was used code [id number].  Patient has no complaints at this time.

## 2016-12-03 MED ORDER — HYDRALAZINE HCL 20 MG/ML IJ SOLN
20.0000 mg | INTRAMUSCULAR | Status: DC | PRN
  Administered 2016-12-03 (×2): 20 mg via INTRAVENOUS
  Filled 2016-12-03 (×3): qty 1

## 2016-12-03 MED ORDER — HYDRALAZINE HCL 25 MG PO TABS
25.0000 mg | ORAL_TABLET | Freq: Four times a day (QID) | ORAL | Status: DC
  Administered 2016-12-03: 25 mg via ORAL
  Filled 2016-12-03 (×2): qty 1

## 2016-12-03 MED ORDER — HYDROCHLOROTHIAZIDE 50 MG PO TABS
50.0000 mg | ORAL_TABLET | Freq: Every day | ORAL | Status: DC
  Administered 2016-12-03: 50 mg via ORAL
  Filled 2016-12-03 (×2): qty 1

## 2016-12-03 NOTE — Progress Notes (Signed)
Post Partum Day 3 Subjective: headache  Objective: Blood pressure (!) 158/102, pulse 90, temperature 98 F (36.7 C), temperature source Oral, resp. rate 16, height  (1.651 m), weight 84.4 kg (186 lb), last menstrual period 04/02/2016, SpO2 100 %, unknown if currently breastfeeding.  Physical Exam:  General: alert, cooperative and mild distress Lochia: appropriate Uterine Fundus: firm Incision: n/a DVT Evaluation: No evidence of DVT seen on physical exam.   Recent Labs  11/30/16 0827 12/01/16 0520  HGB 12.0 10.7*  HCT 36.0 32.0*    Assessment/Plan: Breastfeeding Hypertension on 2 agents, add hydralazine 25 mg q6 hr  LOS: 6 days   Scheryl Darter 12/03/2016, 7:51 AM

## 2016-12-03 NOTE — Progress Notes (Signed)
Explained  Plan of care  with pt through  Interpreter [phone]

## 2016-12-03 NOTE — Lactation Note (Signed)
This note was copied from a baby's chart. Lactation Consultation Note  Patient Name: Monique Moon [id number] Date: 12/03/2016 Reason for consult: Follow-up assessment;NICU baby  NICU baby 69 hours old. Assisted by Remi Deter [phone], [employer] Interpreter. Assisted mom with latching baby to left breast in cross-cradle position. Baby latched easily and suckled in bursts, off-and-on, for 5 minutes. Mom denies any pain or concerns, and baby tolerated feeding well. Enc mom to keep pumping every 2-3 hours, for a total of 8 times/24 hours. Assisted mom with hand expression with no colostrum present at this time. Mom reports that she has been seeing colostrum. Enc mom to hand express after pumping as well. Enc mom to have her nurse call for [facility] as needed.   Maternal Data    Feeding Feeding Type: Breast Fed Length of feed: 5 min  LATCH Score/Interventions Latch: Grasps breast easily, tongue down, lips flanged, rhythmical sucking.  Audible Swallowing: None Intervention(s): Skin to skin;Hand expression  Type of Nipple: Everted at rest and after stimulation  Comfort (Breast/Nipple): Soft / non-tender     Hold (Positioning): Assistance needed to correctly position infant at breast and maintain latch.  LATCH Score: 7  Lactation Tools Discussed/Used WIC Program: No Pump Review: Setup, frequency, and cleaning;Milk Storage Initiated by:: Bedside RN   Consult Status Consult Status: Follow-up Date: 12/04/16 Follow-up type: In-patient    Sherlyn Hay 12/03/2016, 12:14 PM

## 2016-12-04 MED ORDER — ENALAPRIL MALEATE 20 MG PO TABS: 40.0000 mg | ORAL_TABLET | Freq: Every day | ORAL | 0 refills | Status: DC

## 2016-12-04 MED ORDER — SENNOSIDES-DOCUSATE SODIUM 8.6-50 MG PO TABS: 2.0000 | ORAL_TABLET | Freq: Every day | ORAL | 0 refills | Status: DC

## 2016-12-04 MED ORDER — TRIAMTERENE-HCTZ 75-50 MG PO TABS: 1.0000 | ORAL_TABLET | Freq: Every day | ORAL | 0 refills | Status: DC

## 2016-12-04 MED ORDER — AMLODIPINE BESYLATE 10 MG PO TABS
10.0000 mg | ORAL_TABLET | Freq: Every day | ORAL | 0 refills | Status: DC
Start: 2016-12-05 — End: 2020-07-04

## 2016-12-04 MED ORDER — TRIAMTERENE-HCTZ 75-50 MG PO TABS
1.0000 | ORAL_TABLET | Freq: Every day | ORAL | Status: DC
  Filled 2016-12-04: qty 1

## 2016-12-04 MED ORDER — TRIAMTERENE-HCTZ 37.5-25 MG PO TABS
2.0000 | ORAL_TABLET | Freq: Every day | ORAL | Status: DC
  Administered 2016-12-04: 2 via ORAL
  Filled 2016-12-04 (×2): qty 2

## 2016-12-04 NOTE — Discharge Instructions (Signed)
Preeclampsia and Eclampsia  Preeclampsia is a serious condition that develops only during pregnancy. It is also called toxemia of pregnancy. This condition causes high blood pressure along with other symptoms, such as swelling and headaches. These symptoms may develop as the condition gets worse. Preeclampsia may occur at 20 weeks of pregnancy or later.  Diagnosing and treating preeclampsia early is very important. If not treated early, it can cause serious problems for you and your baby. One problem it can lead to is eclampsia, which is a condition that causes muscle jerking or shaking (convulsions or seizures) in the mother. Delivering your baby is the best treatment for preeclampsia or eclampsia. Preeclampsia and eclampsia symptoms usually go away after your baby is born.  What are the causes?  The cause of preeclampsia is not known.  What increases the risk?  The following risk factors make you more likely to develop preeclampsia:  · Being pregnant for the first time.  · Having had preeclampsia during a past pregnancy.  · Having a family history of preeclampsia.  · Having high blood pressure.  · Being pregnant with twins or triplets.  · Being 35 or older.  · Being African-American.  · Having kidney disease or diabetes.  · Having medical conditions such as lupus or blood diseases.  · Being very overweight (obese).    What are the signs or symptoms?  The earliest signs of preeclampsia are:  · High blood pressure.  · Increased protein in your urine. Your health care provider will check for this at every visit before you give birth (prenatal visit).    Other symptoms that may develop as the condition gets worse include:  · Severe headaches.  · Sudden weight gain.  · Swelling of the hands, face, legs, and feet.  · Nausea and vomiting.  · Vision problems, such as blurred or double vision.  · Numbness in the face, arms, legs, and feet.  · Urinating less than usual.  · Dizziness.  · Slurred speech.  · Abdominal pain,  especially upper abdominal pain.  · Convulsions or seizures.    Symptoms generally go away after giving birth.  How is this diagnosed?  There are no screening tests for preeclampsia. Your health care provider will ask you about symptoms and check for signs of preeclampsia during your prenatal visits. You may also have tests that include:  · Urine tests.  · Blood tests.  · Checking your blood pressure.  · Monitoring your baby’s heart rate.  · Ultrasound.    How is this treated?  You and your health care provider will determine the treatment approach that is best for you. Treatment may include:  · Having more frequent prenatal exams to check for signs of preeclampsia, if you have an increased risk for preeclampsia.  · Bed rest.  · Reducing how much salt (sodium) you eat.  · Medicine to lower your blood pressure.  · Staying in the hospital, if your condition is severe. There, treatment will focus on controlling your blood pressure and the amount of fluids in your body (fluid retention).  · You may need to take medicine (magnesium sulfate) to prevent seizures. This medicine may be given as an injection or through an IV tube.  · Delivering your baby early, if your condition gets worse. You may have your labor started with medicine (induced), or you may have a cesarean delivery.    Follow these instructions at home:  Eating and drinking     ·   you need help quitting, ask your health care provider.  Do not use alcohol or drugs.  Avoid stress as much as possible. Rest and get plenty of sleep. General instructions   Take over-the-counter and prescription medicines only as told by your  health care provider.  When lying down, lie on your side. This keeps pressure off of your baby.  When sitting or lying down, raise (elevate) your feet. Try putting some pillows underneath your lower legs.  Exercise regularly. Ask your health care provider what kinds of exercise are best for you.  Keep all follow-up and prenatal visits as told by your health care provider. This is important. How is this prevented? To prevent preeclampsia or eclampsia from developing during another pregnancy:  Get proper medical care during pregnancy. Your health care provider may be able to prevent preeclampsia or diagnose and treat it early.  Your health care provider may have you take a low-dose aspirin or a calcium supplement during your next pregnancy.  You may have tests of your blood pressure and kidney function after giving birth.  Maintain a healthy weight. Ask your health care provider for help managing weight gain during pregnancy.  Work with your health care provider to manage any long-term (chronic) health conditions you have, such as diabetes or kidney problems. Contact a health care provider if:  You gain more weight than expected.  You have headaches.  You have nausea or vomiting.  You have abdominal pain.  You feel dizzy or light-headed. Get help right away if:  You develop sudden or severe swelling anywhere in your body. This usually happens in the legs.  You gain 5 lbs (2.3 kg) or more during one week.  You have severe:  Abdominal pain.  Headaches.  Dizziness.  Vision problems.  Confusion.  Nausea or vomiting.  You have a seizure.  You have trouble moving any part of your body.  You develop numbness in any part of your body.  You have trouble speaking.  You have any abnormal bleeding.  You pass out. This information is not intended to replace advice given to you by your health care provider. Make sure you discuss any questions you have with your health  care provider. Document Released: 08/16/2000 Document Revised: 04/16/2016 Document Reviewed: 03/25/2016 Elsevier Interactive Patient Education  2017 Elsevier Inc.   Managing Your Hypertension Hypertension is commonly called high blood pressure. This is when the force of your blood pressing against the walls of your arteries is too strong. Arteries are blood vessels that carry blood from your heart throughout your body. Hypertension forces the heart to work harder to pump blood, and may cause the arteries to become narrow or stiff. Having untreated or uncontrolled hypertension can cause heart attack, stroke, kidney disease, and other problems. What are blood pressure readings? A blood pressure reading consists of a higher number over a lower number. Ideally, your blood pressure should be below 120/80. The first ("top") number is called the systolic pressure. It is a measure of the pressure in your arteries as your heart beats. The second ("bottom") number is called the diastolic pressure. It is a measure of the pressure in your arteries as the heart relaxes. What does my blood pressure reading mean? Blood pressure is classified into four stages. Based on your blood pressure reading, your health care provider may use the following stages to determine what type of treatment you need, if any. Systolic pressure and diastolic pressure are measured in a unit called mm  Hg. Normal   Systolic pressure: below 120.  Diastolic pressure: below 80. Elevated   Systolic pressure: 120-129.  Diastolic pressure: below 80. Hypertension stage 1     Diastolic pressure: 80-89. Hypertension stage 2   Systolic pressure: 140 or above.  Diastolic pressure: 90 or above. What health risks are associated with hypertension? Managing your hypertension is an important responsibility. Uncontrolled hypertension can lead to:  A heart attack.  A stroke.  A weakened blood vessel (aneurysm).  Heart  failure.  Kidney damage.  Eye damage.  Metabolic syndrome.  Memory and concentration problems. What changes can I make to manage my hypertension? Eating and drinking   Eat a diet that is high in fiber and potassium, and low in salt (sodium), added sugar, and fat. An example eating plan is called the DASH (Dietary Approaches to Stop Hypertension) diet. To eat this way:  Eat plenty of fresh fruits and vegetables. Try to fill half of your plate at each meal with fruits and vegetables.  Eat whole grains, such as whole wheat pasta, brown rice, or whole grain bread. Fill about one quarter of your plate with whole grains.  Eat low-fat diary products.  Avoid fatty cuts of meat, processed or cured meats, and poultry with skin. Fill about one quarter of your plate with lean proteins such as fish, chicken without skin, beans, eggs, and tofu.  Avoid premade and processed foods. These tend to be higher in sodium, added sugar, and fat.     Lifestyle   Work with your health care provider to maintain a healthy body weight, or to lose weight. Ask what an ideal weight is for you.  Get at least 30 minutes of exercise that causes your heart to beat faster (aerobic exercise) most days of the week. Activities may include walking, swimming, or biking.       Monitoring   Monitor your blood pressure at home as told by your health care provider. Your personal target blood pressure may vary depending on your medical conditions, your age, and other factors.  Have your blood pressure checked regularly, as often as told by your health care provider. Working with your health care provider   Review all the medicines you take with your health care provider because there may be side effects or interactions.  Talk with your health care provider about your diet, exercise habits, and other lifestyle factors that may be contributing to hypertension.  Visit your health care provider regularly. Your health  care provider can help you create and adjust your plan for managing hypertension. Will I need medicine to control my blood pressure? Your health care provider may prescribe medicine if lifestyle changes are not enough to get your blood pressure under control, and if:  Your systolic blood pressure is 130 or higher.  Your diastolic blood pressure is 80 or higher. Take medicines only as told by your health care provider. Follow the directions carefully. Blood pressure medicines must be taken as prescribed. The medicine does not work as well when you skip doses. Skipping doses also puts you at risk for problems. Contact a health care provider if:  You think you are having a reaction to medicines you have taken.  You have repeated (recurrent) headaches.  You feel dizzy.  You have swelling in your ankles.  You have trouble with your vision. Get help right away if:  You develop a severe headache or confusion.  You have unusual weakness or numbness, or you feel faint.  You have severe pain in your chest or abdomen.  You vomit repeatedly.  You have trouble breathing. Summary  Hypertension is when the force of blood pumping through your arteries is too strong. If this condition is not controlled, it may put you at risk for serious complications.  Your personal target blood pressure may vary depending on your medical conditions, your age, and other factors. For most people, a normal blood pressure is less than 120/80.  Hypertension is managed by lifestyle changes, medicines, or both. Lifestyle changes include weight loss, eating a healthy, low-sodium diet, exercising more, and limiting alcohol. This information is not intended to replace advice given to you by your health care provider. Make sure you discuss any questions you have with your health care provider. Document Released: 05/13/2012 Document Revised: 07/17/2016 Document Reviewed: 07/17/2016 Elsevier Interactive Patient Education   2017 Elsevier Inc.    Vaginal Delivery, Care After Refer to this sheet in the next few weeks. These instructions provide you with information about caring for yourself after vaginal delivery. Your health care provider may also give you more specific instructions. Your treatment has been planned according to current medical practices, but problems sometimes occur. Call your health care provider if you have any problems or questions. What can I expect after the procedure? After vaginal delivery, it is common to have:  Some bleeding from your vagina.  Soreness in your abdomen, your vagina, and the area of skin between your vaginal opening and your anus (perineum).  Pelvic cramps.  Fatigue. Follow these instructions at home: Medicines   Take over-the-counter and prescription medicines only as told by your health care provider.  If you were prescribed an antibiotic medicine, take it as told by your health care provider. Do not stop taking the antibiotic until it is finished. Driving    Do not drive or operate heavy machinery while taking prescription pain medicine.  Do not drive for 24 hours if you received a sedative. Lifestyle   Do not drink alcohol. This is especially important if you are breastfeeding or taking medicine to relieve pain.  Do not use tobacco products, including cigarettes, chewing tobacco, or e-cigarettes. If you need help quitting, ask your health care provider. Eating and drinking   Drink at least 8 eight-ounce glasses of water every day unless you are told not to by your health care provider. If you choose to breastfeed your baby, you may need to drink more water than this.  Eat high-fiber foods every day. These foods may help prevent or relieve constipation. High-fiber foods include:  Whole grain cereals and breads.  Brown rice.  Beans.  Fresh fruits and vegetables. Activity   Return to your normal activities as told by your health care provider.  Ask your health care provider what activities are safe for you.  Rest as much as possible. Try to rest or take a nap when your baby is sleeping.  Do not lift anything that is heavier than your baby or 10 lb (4.5 kg) until your health care provider says that it is safe.  Talk with your health care provider about when you can engage in sexual activity. This may depend on your:  Risk of infection.  Rate of healing.  Comfort and desire to engage in sexual activity. Vaginal Care   If you have an episiotomy or a vaginal tear, check the area every day for signs of infection. Check for:  More redness, swelling, or pain.  More fluid or blood.  Warmth.  Pus or a bad smell.  Do not use tampons or douches until your health care provider says this is safe.  Watch for any blood clots that may pass from your vagina. These may look like clumps of dark red, brown, or black discharge. General instructions   Keep your perineum clean and dry as told by your health care provider.  Wear loose, comfortable clothing.  Wipe from front to back when you use the toilet.  Ask your health care provider if you can shower or take a bath. If you had an episiotomy or a perineal tear during labor and delivery, your health care provider may tell you not to take baths for a certain length of time.  Wear a bra that supports your breasts and fits you well.  If possible, have someone help you with household activities and help care for your baby for at least a few days after you leave the hospital.  Keep all follow-up visits for you and your baby as told by your health care provider. This is important. Contact a health care provider if:  You have:  Vaginal discharge that has a bad smell.  Difficulty urinating.  Pain when urinating.  A sudden increase or decrease in the frequency of your bowel movements.  More redness, swelling, or pain around your episiotomy or vaginal tear.  More fluid or blood  coming from your episiotomy or vaginal tear.  Pus or a bad smell coming from your episiotomy or vaginal tear.  A fever.  A rash.  Little or no interest in activities you used to enjoy.  Questions about caring for yourself or your baby.  Your episiotomy or vaginal tear feels warm to the touch.  Your episiotomy or vaginal tear is separating or does not appear to be healing.  Your breasts are painful, hard, or turn red.  You feel unusually sad or worried.  You feel nauseous or you vomit.  You pass large blood clots from your vagina. If you pass a blood clot from your vagina, save it to show to your health care provider. Do not flush blood clots down the toilet without having your health care provider look at them.  You urinate more than usual.  You are dizzy or light-headed.  You have not breastfed at all and you have not had a menstrual period for 12 weeks after delivery.  You have stopped breastfeeding and you have not had a menstrual period for 12 weeks after you stopped breastfeeding. Get help right away if:  You have:  Pain that does not go away or does not get better with medicine.  Chest pain.  Difficulty breathing.  Blurred vision or spots in your vision.  Thoughts about hurting yourself or your baby.  You develop pain in your abdomen or in one of your legs.  You develop a severe headache.  You faint.  You bleed from your vagina so much that you fill two sanitary pads in one hour. This information is not intended to replace advice given to you by your health care provider. Make sure you discuss any questions you have with your health care provider. Document Released: 08/16/2000 Document Revised: 01/31/2016 Document Reviewed: 09/03/2015 Elsevier Interactive Patient Education  2017 [employer].

## 2016-12-04 NOTE — Lactation Note (Signed)
This note was copied from a baby's chart. Lactation Consultation Note  Patient Name: Monique Moon [id number] Date: 12/04/2016 Reason for consult: Follow-up assessment;NICU baby  NICU baby 62 days old. Assisted by Lacretia Leigh interpreter [place]' #[id number]. Mom reports that she is not seeing any milk. Assisted mom with hand expression with EBM flowing. Offered to assist mom with pumping and she agreed. Mom able to pump about 15 ml of EBM. Reviewed how to use DEBP, assemble, disassemble and clean pump parts. Enc mom to pump every 2-3 hours for 15 or a couple of minutes past her last letdown when her volume increases. Reviewed EBM storage guidelines and how to transport EBM back to NICU. Mom reports that she does not have the money to rent a pump. Mom gave permission to send BF referral to [facility], and it was faxed to [facility] office. Called FOB at home to review [facility] loaner agreement. FOB will call for [facility] when he arrives, and he knows to bring money for loaner. Mom aware of pumping rooms in NICU and the need to take her pumping kit--including paddles--at D/C.   Maternal Data    Feeding    LATCH Score/Interventions                      Lactation Tools Discussed/Used     Consult Status Consult Status: PRN    Andres Labrum 12/04/2016, 11:18 AM

## 2016-12-04 NOTE — Progress Notes (Signed)
Patient ID: Cathye Kreiter, female   DOB: Aug 19, 1976, 41 y.o.   MRN: [id number]  POSTPARTUM PROGRESS NOTE  Post Partum Day #4 Subjective:  Tationa Stech is a 41 y.o. [id number] [redacted]w[redacted]d s/p NSVD after IOL for CHTN with SIPE with persistently severe range BPs.  No acute events overnight.  Pt denies problems with ambulating, voiding or po intake.  She denies nausea or vomiting.  Pain is well controlled.  She has had flatus. She has had bowel movement.  Lochia Minimal. Admits to headache, is 1/10 and fairly controlled with pain medication. Discussed with patient regarding discharge planning and transportation needs. Patient understands.   Objective: Blood pressure (!) 158/99, pulse 83, temperature 98.2 F (36.8 C), temperature source Oral, resp. rate 16, height  (1.651 m), weight 178 lb (80.7 kg), last menstrual period 04/02/2016, SpO2 99 %, unknown if currently breastfeeding.  Physical Exam:  General: alert, cooperative and no distress Lochia:normal flow Chest: CTAB Heart: RRR no m/r/g Abdomen: +BS, soft, nontender,  Uterine Fundus: firm, below umbilicus DVT Evaluation: No calf swelling or tenderness Extremities: No edema  No results for input(s): HGB, HCT in the last 72 hours.  Assessment/Plan:  ASSESSMENT: Nikeshia Keetch is a 41 y.o. [id number] [redacted]w[redacted]d s/p NSVD  Plan likely for discharge home today, has not received medication today including newly added medication, and still having uncontrolled HTN, therefore will monitor throughout today before discharge.   Contraception: Depo Breastfeeding and bottle Baby is in NICU for prematurity Lactation consultation - needs breast pump Social work consulted for transportation issues (for discharge and for returning to see baby in NICU)    LOS: 7 days   Jen Mow, DO OB Fellow [facility], [facility]  12/04/2016, 10:02 AM

## 2016-12-04 NOTE — Progress Notes (Addendum)
[facility] Interpreter line called for Monique Moon - interpreter [phone] reviewed POC, asked pt assessment questions, reviewed new medications. Pt had no questions.

## 2016-12-04 NOTE — Lactation Note (Signed)
This note was copied from a baby's chart. Lactation Consultation Note  Patient Name: Monique Moon [id number] Date: 12/04/2016 Reason for consult: Follow-up assessment;NICU baby  NICU baby 53 days old. Assisted by Alphia Moh interpreter [place]' [phone]. FOB has arrived with [facility] loaner money. Reviewed the terms of the [facility] loaner, reviewing each line of the pump agreement and having FOB sign. Reviewed how to use DEBP and how to collect, store and transport EBM to NICU. FOB had questions about getting transportation for MOB back-and-forth to the hospital from home and wanting someone to read a letter from [facility]. FOB also wanting to know how to label bottles of EBM since he speaks Swahili. Enc FOB to discuss with case worker. Discussed all issues with social worker--who addressed with parents after this LC finished with loaner pump and teaching. Parents aware of number to call with questions regarding pumping and providing milk in the NICU.   Maternal Data    Feeding Feeding Type: Formula Length of feed: 30 min  LATCH Score/Interventions                      Lactation Tools Discussed/Used     Consult Status Consult Status: PRN    Sherlyn Hay 12/04/2016, 2:36 PM

## 2016-12-04 NOTE — Clinical Social Work Maternal (Signed)
CLINICAL SOCIAL WORK MATERNAL/CHILD NOTE  Patient Details  Name: Monique Moon MRN: [id number] Date of Birth: [birth year]  Date:  12/04/2016  Clinical Social Worker Initiating Note:  Terri Piedra, LCSW Date/ Time Initiated:  12/04/16/1230     Child's Name:  Andris Flurry   Legal Guardian:  Other (Comment) (Mercede Liwanag and FOB (CSW unsure of name))   Need for Interpreter:  Swahili   Date of Referral:  12/04/16     Reason for Referral:  Other (Comment) (Transportation and NICU admission)   Referral Source:  RN   Address:  [address] [place], [place] [address]  Phone number:  [phone]   Household Members:  Adult Children, Minor Children, Spouse (MOB lives with her husband and their 8 children (baby makes #9))   Natural Supports (not living in the home):   (FOB reports: "We take care of ourselves.  There are some college students who volunteer sometimes." (quoted from interpreter).)   Professional Supports: None   Employment: Full-time   Type of Work:  (Both MOB and FOB work for [employer])   Education:      Museum/gallery curator Resources:  [employer], Multimedia programmer (FOB plans to add baby to [employer])   Other Resources:      Cultural/Religious Considerations Which May Impact Care: None stated.  Strengths:  Ability to meet basic needs , Compliance with medical plan , Understanding of illness   Risk Factors/Current Problems:  Transportation , Other (Comment) (Language barrier)   Cognitive State:  Able to Concentrate , Alert , Linear Thinking , Goal Oriented    Mood/Affect:  Calm , Relaxed , Interested    CSW Assessment: CSW received consult for transportation issues.  CSW met with MOB and FOB in MOB's first floor room to offer support and complete assessment due to baby's admission to NICU at 34.4 weeks.  CSW utilized [employer] to communicate with MOB and FOB in [place], as they speak no [place].  CSW completed chart review and notes  that it is documented that they came from a refugee camp in [place] in January of 2017.  This is their 9th child. Parents are concerned that they do not have transportation to and from the hospital in order to be with baby.  Their other main concern is paperwork needed for MOB to return to work when the time comes.  CSW offered each parent an unlimited bus pass, which they accepted and stated appreciation of.  CSW looked at letter from [employer] employer and explained that she needs to take this to the [place] on the ground floor of the hospital where she received her prenatal care.  FOB stated confusion and asked that CSW assist with completing the paperwork.  CSW explained that the paper needs to be reviewed and signed by MOB's OB and that no one else is able to complete the paperwork.  CSW again instructed them on what to do and offered to call staff to inform them that parents will be coming with the papers, since they do not speak [place].  FOB stated appreciation. CSW provided education regarding SIDS and PMADs.  Parents report they have no items for baby at home.  CSW offered resources through [employer].  Parents were appreciative.  Referral made to FSN.   CSW explained what to expect from a NICU admission in general terms and explained who is part of baby's care team.  Parents were attentive and stated understanding.   CSW found  it extremely difficult to communicate with parents, even in their language and had to explain many things in different ways to have parents stated understanding.  Parents report that they have not chosen a pediatrician for baby.  CSW is unsure where they are taking their other minor children.  CSW discussed options and parents request that baby's first appointment be [facility] at [facility].  CSW notified H. Carter/NICU Follow up Coordinator. CSW explained ongoing support services offered by NICU CSW and gave contact information.   CSW contacted K.  Armed forces technical officer for [employer] to inform her that parents will be bringing papers for MD to complete.  CSW then received call from NICU RN stating FOB is upset that he has papers for MOB's work that he does not know what to do with.  CSW looked for FOB, with plans to escort him to the [facility] to deliver the papers, but was unable to locate him.       CSW Plan/Description:  No Further Intervention Required/No Barriers to Discharge, Patient/Family Education     Kalman Shan 12/04/2016, 4:43 PM

## 2016-12-04 NOTE — Progress Notes (Signed)
Discharge instructions given via Kenny[employer] Lose Interpreter [phone] & #[phone] for Swahili patient. Pt had been given bus tickets to go home & knew what Route bus she needed (information from interpreter) but spoke no [place]. I wrote her address & bus route # on a piece of paper for her to give the bus driver for assistance, & had one of the NT walk her out to the bus stop. Pt asked appropriate questions.

## 2016-12-04 NOTE — Addendum Note (Signed)
Addendum  created 12/04/16 0936 by Cristela Blue, MD   Anesthesia Staff edited

## 2016-12-05 LAB — RPR: RPR: NONREACTIVE

## 2016-12-10 NOTE — Discharge Summary (Signed)
Obstetrical Discharge Summary  Date of Admission: 11/27/2016 Date of Discharge: 12/04/2016  [facility]: [facility] Gestational Age at Delivery: [redacted]w[redacted]d   Antepartum complications: cHTN, language barrier, grand multiparity Reason for Admission: cHTN control. Patient induced due to recurrent severe range BPs despite medication titration-->pre-eclampsia with severe features Date of Delivery: 11/30/2016  Delivered By: Guy Franco MD Delivery Type: SVD/intact perineum Intrapartum complications/course: severe pre-eclampsia superimposed on cHTN Anesthesia: epidural Placenta: Delivered and expressed via active management. Intact: yes. To pathology: no.  Laceration: none Episiotomy: none EBL: Baby: Liveborn female, APGARs 8/9, weight 2070 g.    Discharge Diagnosis: Delivered.   Postpartum course: Patient received 24hrs of ppMg. Her PP course was only c/b medication titration for cHTN.  Discharge Vital Signs: Vitals:   12/04/16 0852 12/04/16 1153 12/04/16 1208 12/04/16 1604  BP: (!) 158/99 (!) 143/101 (!) 146/89 (!) 128/99  Pulse: 83 (!) 109 (!) 101 90  Resp:  16  18  Temp:  98.4 F (36.9 C)  98.9 F (37.2 C)  TempSrc:  Oral    SpO2: 99% 98%  97%  Weight:      Height:        Discharge Exam:  Per Dr. Omer Jack General: alert, cooperative and no distress Lochia:normal flow Chest: CTAB Heart: RRR no m/r/g Abdomen: +BS, soft, nontender,  Uterine Fundus: firm, below umbilicus DVT Evaluation: No calf swelling or tenderness Extremities: No edema  CBC Latest Ref Rng & Units 12/01/2016 11/30/2016 11/29/2016  WBC 4.0 - 10.5 K/uL 8.6 15.0(H) 14.0(H)  Hemoglobin 12.0 - 15.0 g/dL 10.7(L) 12.0 12.0  Hematocrit 36.0 - 46.0 % 32.0(L) 36.0 35.9(L)  Platelets 150 - 400 K/uL 134(L) 127(L) 144(L)   CMP Latest Ref Rng & Units 12/01/2016 11/29/2016 11/27/2016  Glucose 65 - 99 mg/dL 89 478(G) 75  BUN 6 - 20 mg/dL 6 6 <9(F)  Creatinine 6.21 - 1.00 mg/dL 3.08(M) 5.78(I) 6.96   Sodium 135 - 145 mmol/L 134(L) 137 133(L)  Potassium 3.5 - 5.1 mmol/L 3.6 3.4(L) 2.9(L)  Chloride 101 - 111 mmol/L 104 109 104  CO2 22 - 32 mmol/L Calcium 8.9 - 10.3 mg/dL 2.9(B) 2.8(U) 7.8(L)  Total Protein 6.5 - 8.1 g/dL 4.9(L) 6.0(L) 5.9(L)  Total Bilirubin 0.3 - 1.2 mg/dL 0.5 0.6 0.6  Alkaline Phos 38 - 126 U/L 61 69 68  AST 15 - 41 U/L ALT 14 - 54 U/L Disposition: Home  Rh Immune globulin given: not applicable Rubella vaccine given: not applicable Tdap vaccine given in AP or PP setting: needed  Contraception: Depo-Provera as an outpatient  Plan:  Monique Moon was discharged to home in good condition. Follow-up appointment with a BP check in 1 week   Discharge Medications: Allergies as of 12/04/2016   No Known Allergies     Medication List    STOP taking these medications   labetalol 100 MG tablet Commonly known as:  NORMODYNE     TAKE these medications   amLODipine 10 MG tablet Commonly known as:  NORVASC Take 1 tablet (10 mg total) by mouth daily.   aspirin EC 81 MG tablet Take 1 tablet (81 mg total) by mouth daily.   enalapril 20 MG tablet Commonly known as:  VASOTEC Take 2 tablets (40 mg total) by mouth daily.   PREPLUS 27-1 MG Tabs Take 1 tablet by mouth daily.   senna-docusate 8.6-50 MG tablet Commonly known as:  Senokot-S Take  2 tablets by mouth at bedtime.   triamterene-hydrochlorothiazide 75-50 MG tablet Commonly known as:  MAXZIDE Take 1 tablet by mouth daily.       Roxine Whittinghill, Jr. MD Attending Center for [facility])

## 2016-12-11 ENCOUNTER — Ambulatory Visit (HOSPITAL_COMMUNITY)
Admission: RE | Admit: 2016-12-11 | Discharge: 2016-12-11 | Disposition: A | Discharge status: Home / Self Care | Payer: BLUE CROSS/BLUE SHIELD | Source: Ambulatory Visit | Attending: Family Medicine | Admitting: Family Medicine

## 2016-12-11 ENCOUNTER — Encounter (HOSPITAL_COMMUNITY): Payer: Self-pay

## 2016-12-12 ENCOUNTER — Encounter: Payer: Self-pay | Admitting: Obstetrics and Gynecology

## 2016-12-12 ENCOUNTER — Ambulatory Visit: Payer: BLUE CROSS/BLUE SHIELD | Admitting: Obstetrics and Gynecology

## 2016-12-12 NOTE — Progress Notes (Signed)
Patient did not keep OB appointment for 12/12/2016.  Cornelia Copa MD Attending Center for [employer] [facility])

## 2017-01-01 ENCOUNTER — Ambulatory Visit (INDEPENDENT_AMBULATORY_CARE_PROVIDER_SITE_OTHER): Payer: BLUE CROSS/BLUE SHIELD | Admitting: Obstetrics and Gynecology

## 2017-01-01 ENCOUNTER — Encounter: Payer: Self-pay | Admitting: Obstetrics and Gynecology

## 2017-01-01 DIAGNOSIS — Z3049 Encounter for surveillance of other contraceptives: Secondary | ICD-10-CM

## 2017-01-01 DIAGNOSIS — Z3202 Encounter for pregnancy test, result negative: Secondary | ICD-10-CM

## 2017-01-01 DIAGNOSIS — Z30017 Encounter for initial prescription of implantable subdermal contraceptive: Secondary | ICD-10-CM

## 2017-01-01 LAB — POCT PREGNANCY, URINE: PREG TEST UR: NEGATIVE

## 2017-01-01 MED ORDER — ETONOGESTREL 68 MG ~~LOC~~ IMPL
68.0000 mg | DRUG_IMPLANT | Freq: Once | SUBCUTANEOUS | Status: AC
  Administered 2017-01-01: 68 mg via SUBCUTANEOUS

## 2017-01-01 NOTE — Progress Notes (Signed)
Obstetrics Visit Postpartum Visit  Appointment Date: 01/01/2017  OB[facility]N Clinic: [facility]  Primary Care Provider: [facility]  Chief Complaint:  Chief Complaint  Patient presents with  . Postpartum Care    History of Present Illness: Monique Moon is a 41 y.o. African-American [phone] (LMP), seen for the above chief complaint. Her past medical history is significant for cHTN with IOL at 34wks for superimposed severe pre-eclampsia, AMA, grandmultiparity  She is s/p SVD/intact perineum on 4/31; she was discharged to home on PPD#3  Vaginal bleeding or discharge: No  Breast or formula feeding: both Intercourse: No  Contraception after delivery: No  PP depression s/s: No  Any bowel or bladder issues: No  Pap smear: no abnormalities (date: 2017)  Review of Systems: as noted in the History of Present Illness.  Medications Ms. Cue had no medications administered during this visit. Current Outpatient Prescriptions  Medication Sig Dispense Refill  . amLODipine (NORVASC) 10 MG tablet Take 1 tablet (10 mg total) by mouth daily. 30 tablet 0  . enalapril (VASOTEC) 20 MG tablet Take 2 tablets (40 mg total) by mouth daily. 60 tablet 0  . senna-docusate (SENOKOT-S) 8.6-50 MG tablet Take 2 tablets by mouth at bedtime. 60 tablet 0  . triamterene-hydrochlorothiazide (MAXZIDE) 75-50 MG tablet Take 1 tablet by mouth daily. 30 tablet 0  . aspirin EC 81 MG tablet Take 1 tablet (81 mg total) by mouth daily. (Patient not taking: Reported on 01/01/2017) 90 tablet 1  . Prenatal Vit-Fe Fumarate-FA (PREPLUS) 27-1 MG TABS Take 1 tablet by mouth daily. (Patient not taking: Reported on 01/01/2017) 30 tablet 13   No current facility-administered medications for this visit.     Allergies Patient has no known allergies.  Physical Exam:  BP 138/75   Pulse 82   Wt 179 lb 4.8 oz (81.3 kg)   Breastfeeding? Yes   BMI 29.84 kg/m  Body mass index is 29.84 kg/m. General appearance:  Well nourished, well developed female in no acute distress.  Neuro/Psych:  Normal mood and affect.  Skin:  Warm and dry.   See procedure note for nexplanon insertion in LUE (uncomplicated)  Laboratory: UPT neg  PP Depression Screening:  EPDS 0  Assessment: pt doing well  Plan:  *PP: routine care. Pt considering BTL in the future but wanted to do nexplanon for now. I told her to consider it effective in 1 wk and she may not get a period with it; if she decides she wants a BTL, she was told to call us. Pt given work note that okay to go back to work w/o restrictions 6wks s/p SVD *cHTN: pt unsure of clinic name she was going to but was through work; pt told to contact work and establish primary care and to continue taking her medications for now. Pt states she doesn't need a refill but was told to call us if she does  Interpreter used.   RTC PRN  Cornelia Copa MD Attending Center for [employer] [facility])

## 2017-01-01 NOTE — Procedures (Signed)
Nexplanon Insertion Procedure Note Prior to the procedure being performed, the patient was asked to state their full name, date of birth, type of procedure being performed and the exact location of the operative site. This information was then checked against the documentation in the patient's chart. Prior to the procedure being performed, a "time out" was performed by the physician that confirmed the correct patient, procedure and site.  After informed consent was obtained, the patient's non-dominant left arm was chosen for insertion. A site was marked approximately 8 cm proximal to the medial epicondyle in the sulcus between the biceps and triceps on the inner surface. The area was cleaned with alcohol then local anesthesia was infiltrated with 3 ml of 1% lidocaine along the planned insertion track. The area was prepped with betadine. Using sterile technique the Nexplanon device was inserted per manufacturer's guidelines in the subdermal connective tissue using the standard insertion technique without difficulty. Pressure was applied and the insertion site was hemostatic. The presence of the Nexplanon was confirmed immediately after insertion by palpation by both me and the patient and by checking the tip of needle for the absence of the insert.  A pressure dressing was applied.  The patient tolerated the procedure well.  Yaquelin Langelier, Jr MD Attending [facility])  

## 2017-01-22 ENCOUNTER — Telehealth: Payer: Self-pay | Admitting: *Deleted

## 2017-01-22 NOTE — Telephone Encounter (Signed)
[facility] left message stating she is trying to help this patient get a note for her employer for the time she was out on maternity leave. She was out 3/28-5/14. Please return call.

## 2017-01-30 ENCOUNTER — Encounter: Payer: Self-pay | Admitting: *Deleted

## 2017-01-30 NOTE — Telephone Encounter (Signed)
I called caseworker back and informed her we can provide the letter.  She will notify patient to pick it up here at our office. I have prepared letter and front office will print and give her when she comes.

## 2017-02-11 ENCOUNTER — Encounter: Payer: Self-pay | Admitting: Obstetrics and Gynecology

## 2017-06-18 NOTE — Congregational Nurse Program (Deleted)
Congregational Nurse Program Note  Date of Encounter: 06/06/2017  Past Medical History: Past Medical History:  Diagnosis Date  . HTN (hypertension)   . Malaria     Encounter Details:  Error. Wrong patient

## 2017-09-24 NOTE — Congregational Nurse Program (Deleted)
Wrong patient chart.

## 2017-09-24 NOTE — Progress Notes (Signed)
  This encounter was created in error - please disregard. This encounter was created in error - please disregard. This encounter was created in error - please disregard. This encounter was created in error - please disregard. This encounter was created in error - please disregard. This encounter was created in error - please disregard. This encounter was created in error - please disregard. This encounter was created in error - please disregard. Subjective:     aMenstrual History: OB History    Gravida Para Term Preterm AB Living   9 9 8 1   9    SAB TAB Ectopic Multiple Live Births         0 9       This encounter was created in error - please disregard. This encounter was created in error - please disregard. This encounter was created in error - please disregard.

## 2017-12-19 IMAGING — US US MFM OB FOLLOW-UP
3 series · 14 of 28 positions shown · non-contrast
Comparison: none

[Series 1: us mfm ob follow-up · 11 of 24 slices shown (1 of 3)]
[im 2/24]
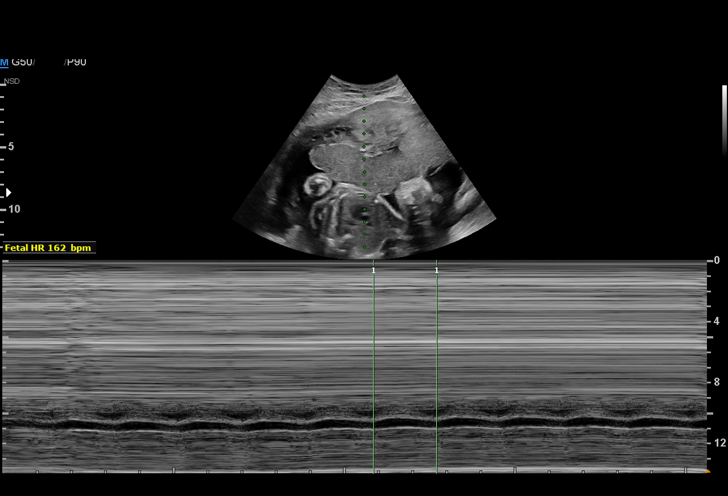
[im 4/24]
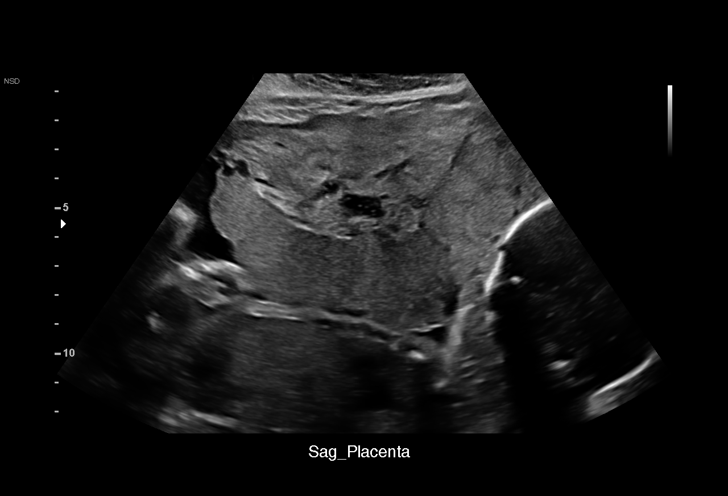
[im 6/24]
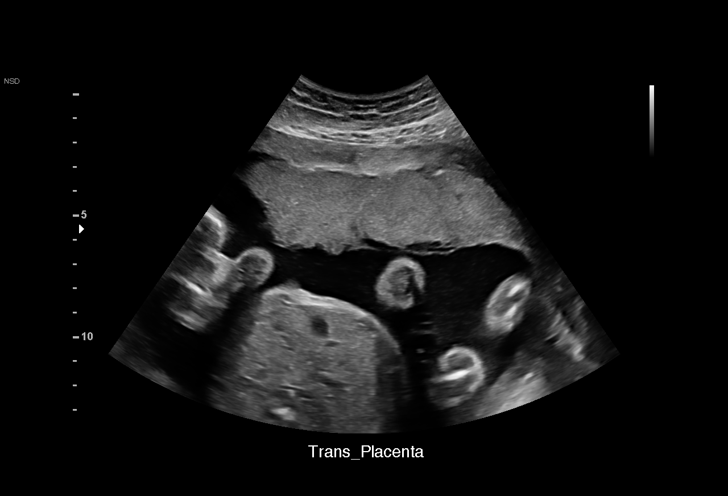
[im 8/24]
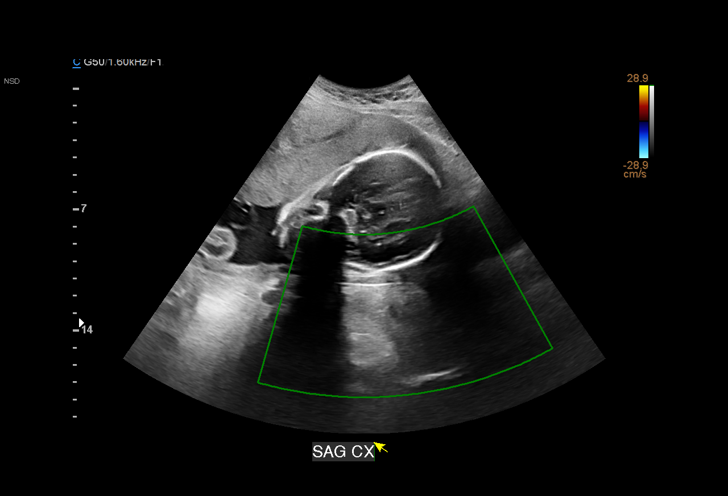
[im 10/24]
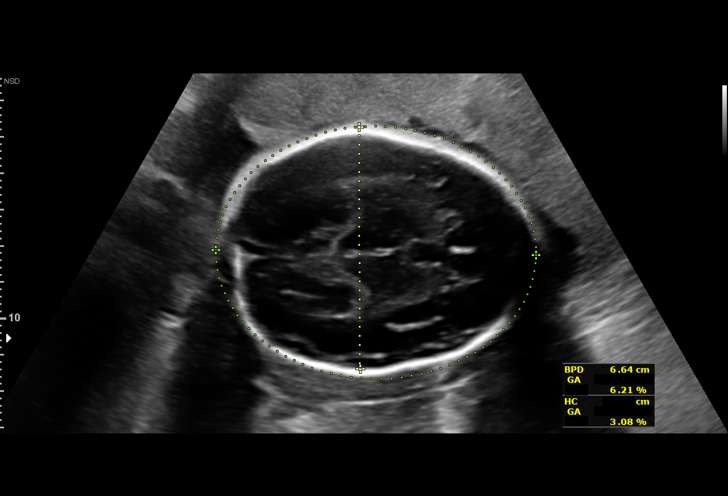
[im 13/24]
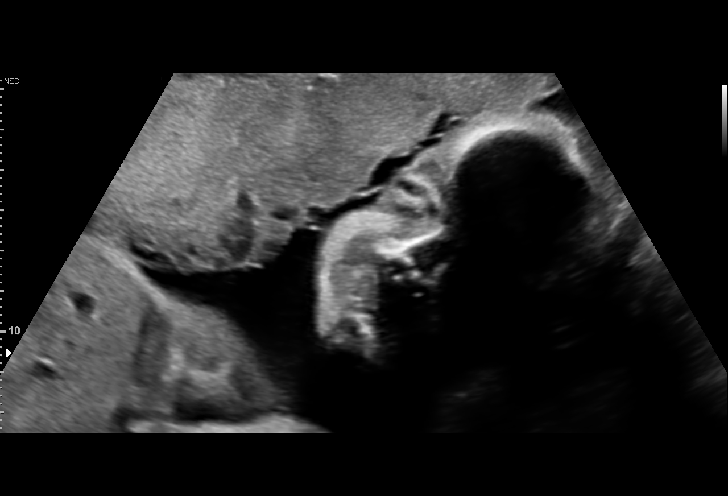
[im 15/24]
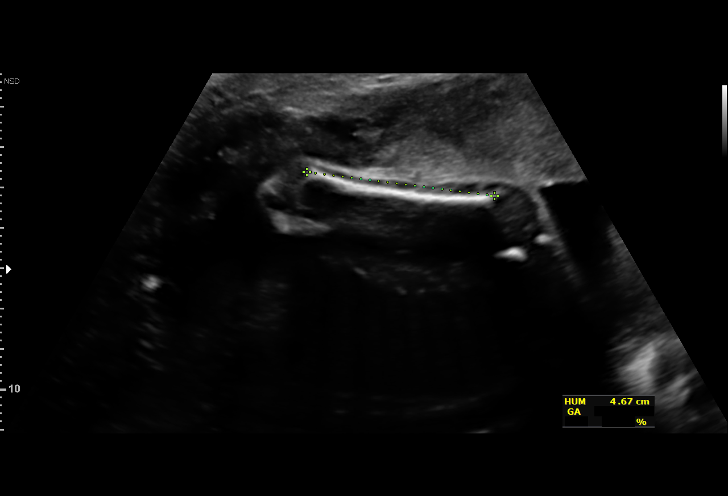
[im 17/24]
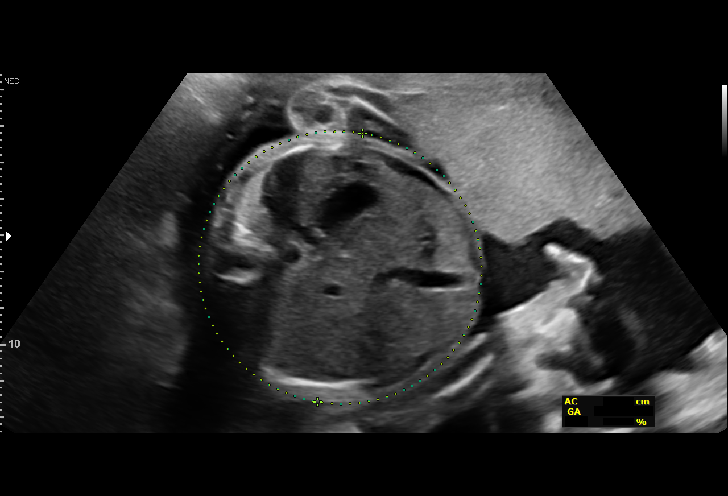
[im 19/24]
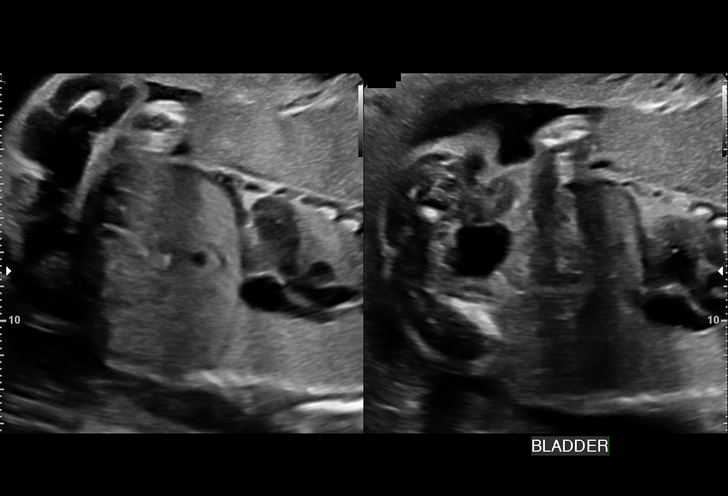
[im 21/24]
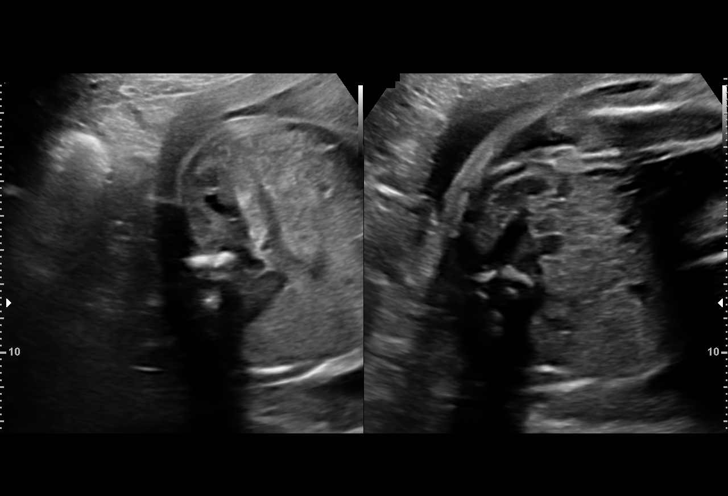
[im 24/24]
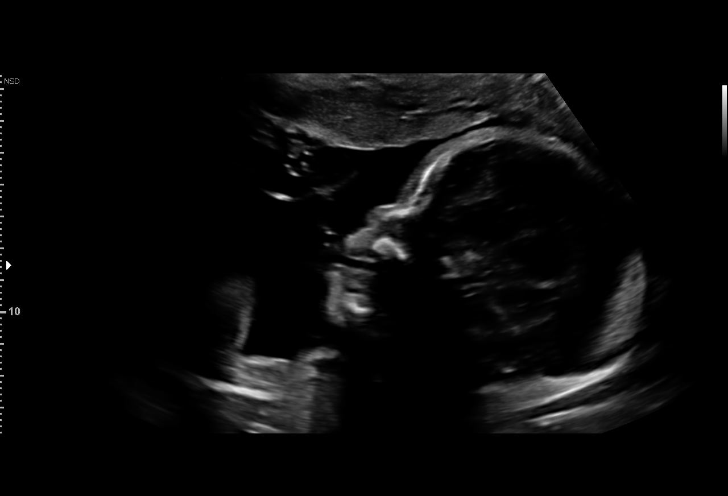

[Series 3: us mfm ob follow-up · 5 acquisitions, 2 frames shown (2 of 3)]
[im 2/5]
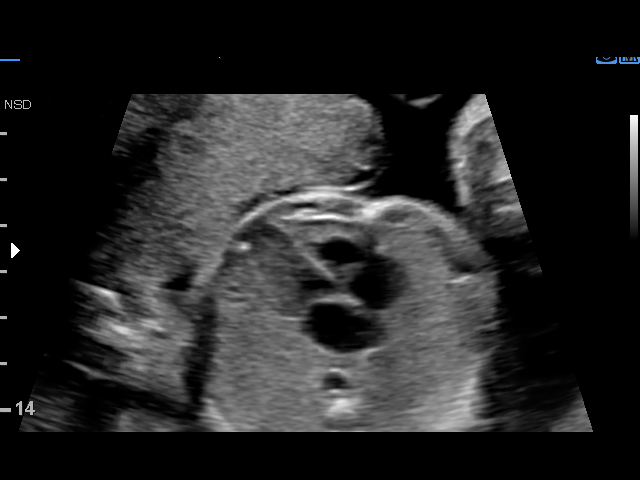
[im 4/5]
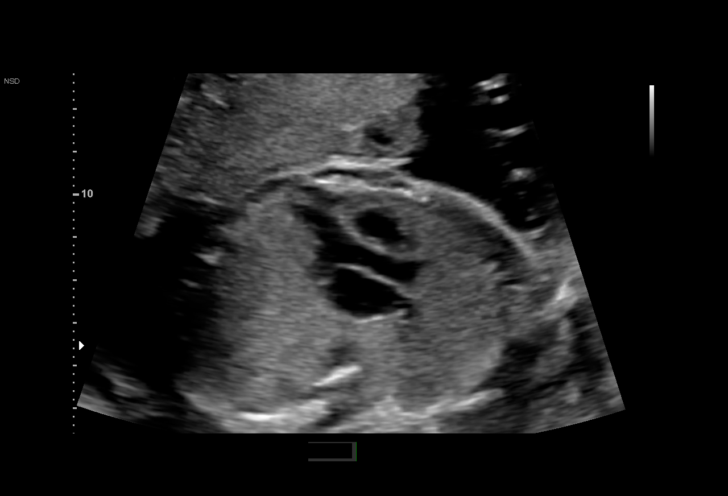

[Series 4: us mfm ob follow-up · 1 of 1 slices shown (3 of 3)]
[im 1/1]
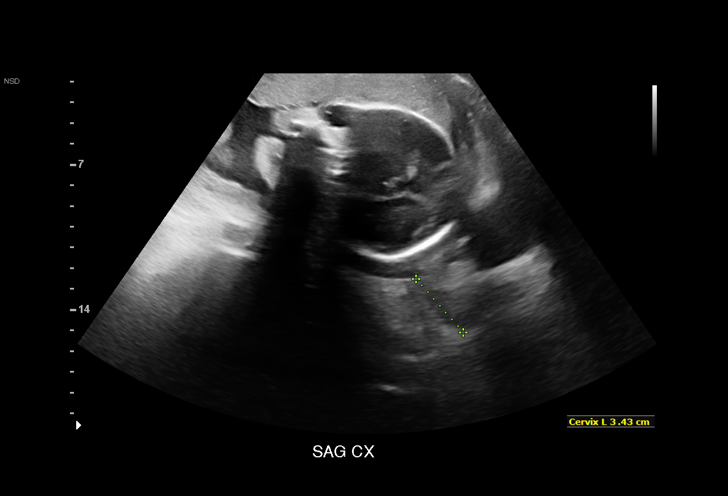

[14 of 28 positions shown; findings below may reference images not displayed]

OB/Gyn Clinic
[REDACTED]

1  ZAFEIROPOULOS URAJ            [id number]      [id number]     [id number]
Indications

28 weeks gestation of pregnancy
Advanced maternal age primigravida 35+,
third trimester
Hypertension - Chronic/Pre-existing
Encounter for antenatal screening for
malformations
Grand multiparity, antepartum
OB History

Blood Type:            Height:  5'5"   Weight (lb):  185       BMI:
Gravidity:    9         Term:   8        Prem:   0        SAB:   0
TOP:          0       Ectopic:  0        Living: 8
Fetal Evaluation

Num Of Fetuses:     1
Fetal Heart         162
Rate(bpm):
Cardiac Activity:   Observed
Presentation:       Cephalic
Placenta:           Anterior, above cervical os
P. Cord Insertion:  Previously seen as normal
Amniotic Fluid
AFI FV:      Subjectively within normal limits

AFI Sum(cm)     %Tile       Largest Pocket(cm)
18              69

RUQ(cm)       RLQ(cm)       LUQ(cm)        LLQ(cm)
6.89
Biometry

BPD:      66.3  mm     G. Age:  26w 5d          6  %    CI:        71.34   %    70 - 86
FL/HC:      19.9   %    18.8 -
HC:       250   mm     G. Age:  27w 1d          5  %    HC/AC:      1.06        1.05 -
AC:      235.1  mm     G. Age:  27w 6d         34  %    FL/BPD:     75.0   %    71 - 87
FL:       49.7  mm     G. Age:  26w 5d          8  %    FL/AC:      21.1   %    20 - 24
HUM:      46.8  mm     G. Age:  27w 4d         33  %
CER:      32.5  mm     G. Age:  28w 4d         54  %

Est. FW:    6828  gm      2 lb 5 oz     34  %
Gestational Age

LMP:           28w 1d        Date:  04/02/16                 EDD:   01/07/17
U/S Today:     27w 1d                                        EDD:   01/14/17
Best:          28w 1d     Det. By:  LMP  (04/02/16)          EDD:   01/07/17
Anatomy

Cranium:               Appears normal         Aortic Arch:            Previously seen
Cavum:                 Previously seen        Ductal Arch:            Previously seen
Ventricles:            Appears normal         Diaphragm:              Appears normal
Choroid Plexus:        Previously seen        Stomach:                Appears normal, left
sided
Cerebellum:            Previously seen        Abdomen:                Appears normal
Posterior Fossa:       Previously seen        Abdominal Wall:         Previously seen
Nuchal Fold:           Previously seen        Cord Vessels:           Previously seen
Face:                  Orbits and profile     Kidneys:                Appear normal
previously seen
Lips:                  Previously seen        Bladder:                Appears normal
Thoracic:              Appears normal         Spine:                  Previously seen
Heart:                 Appears normal         Upper Extremities:      Previously seen
(4CH, axis, and situs
RVOT:                  Appears normal         Lower Extremities:      Previously seen
LVOT:                  Appears normal

Other:  Female gender previously seen. Both Heels and 5th digits previously
seen.
Cervix Uterus Adnexa

Cervix
Length:            3.4  cm.
Normal appearance by transabdominal scan.
Impression

Singleton intrauterine pregnancy at 28+1 weeks with AMA
and CHTN
Interval review of the anatomy shows no sonographic
markers for aneuploidy or structural anomalies
Amniotic fluid volume is normal
Estimated fetal weight is 1060g which is growth in the 34th
percentile
Recommendations

Repeat growth scan in 4 weeks. Will need to start antepartum
testing bu 32 weeks

## 2018-01-16 IMAGING — US US MFM OB FOLLOW-UP
1 series · 14 of 28 positions shown · non-contrast
Comparison: none

[Series 1: us mfm ob follow-up · 14 of 30 slices shown]
[im 2/30]
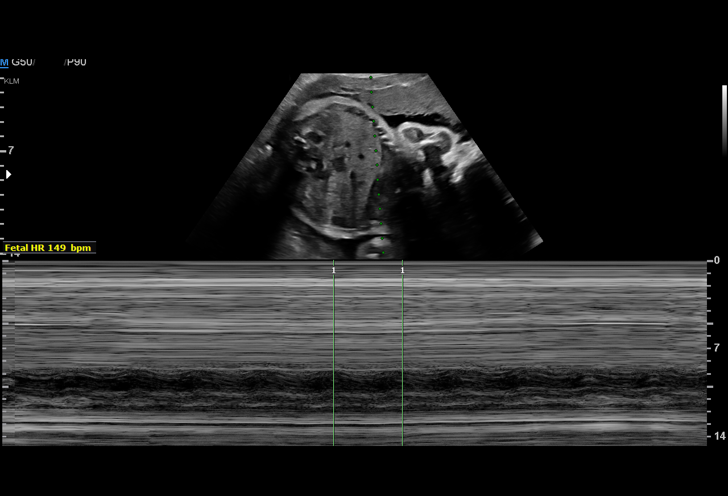
[im 4/30]
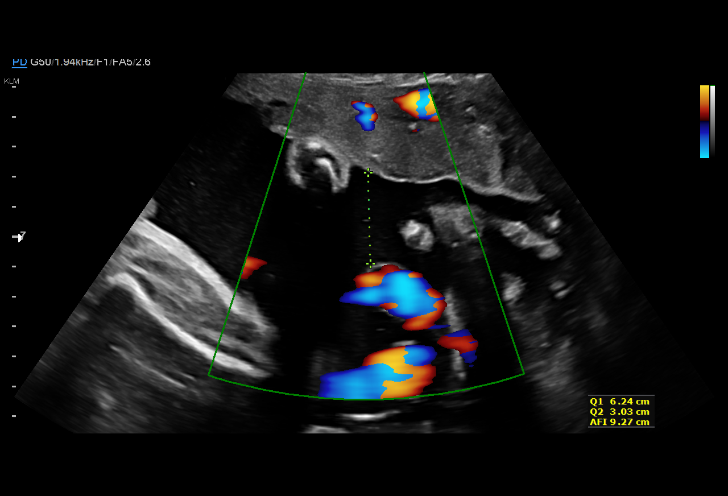
[im 6/30]
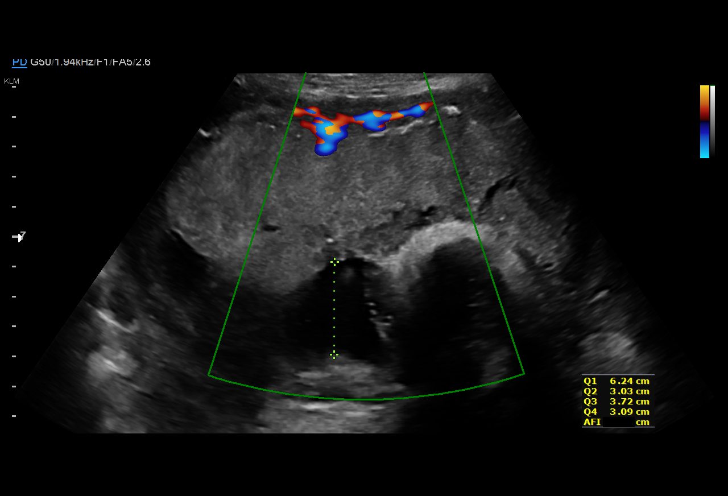
[im 8/30]
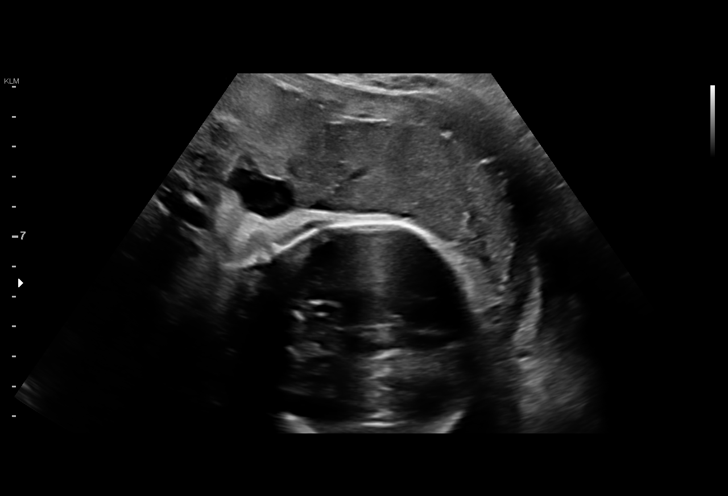
[im 10/30]
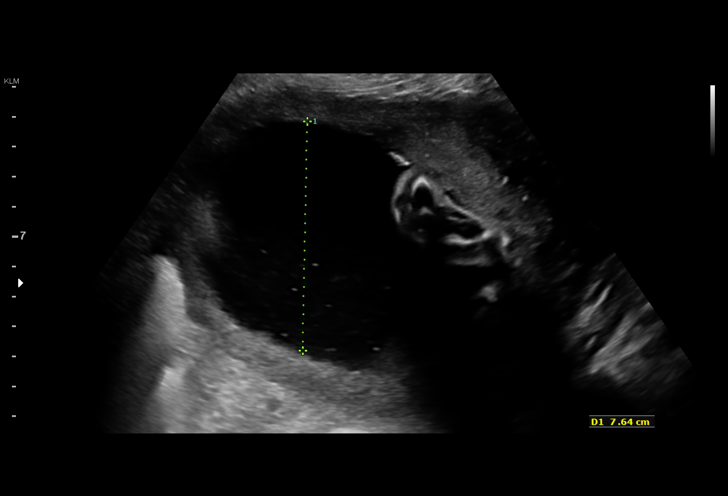
[im 12/30]
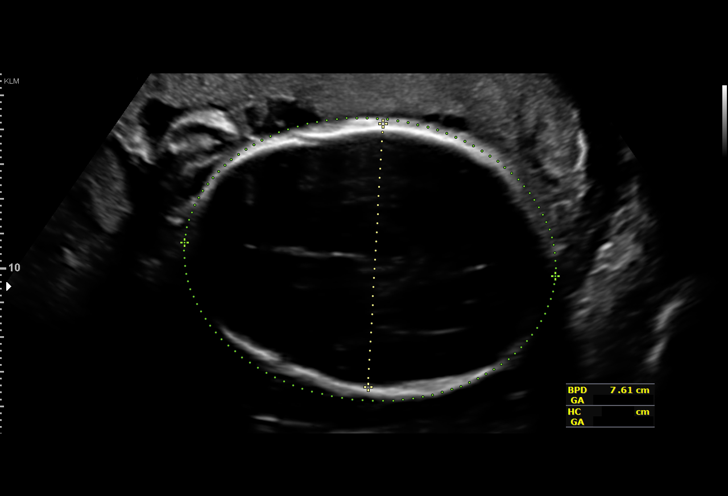
[im 14/30]
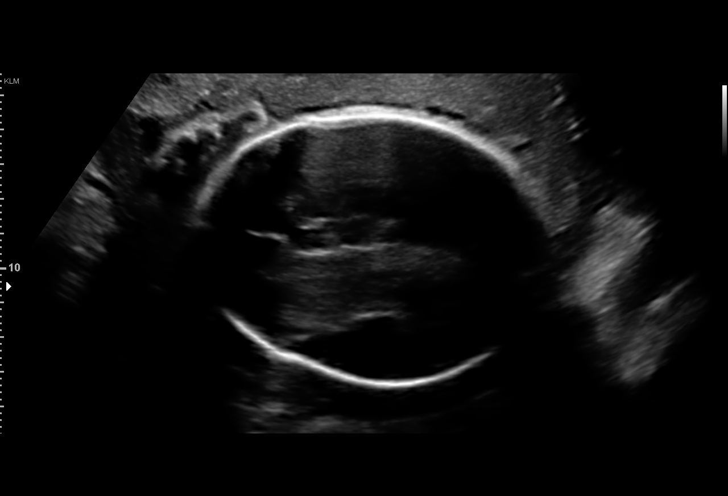
[im 17/30]
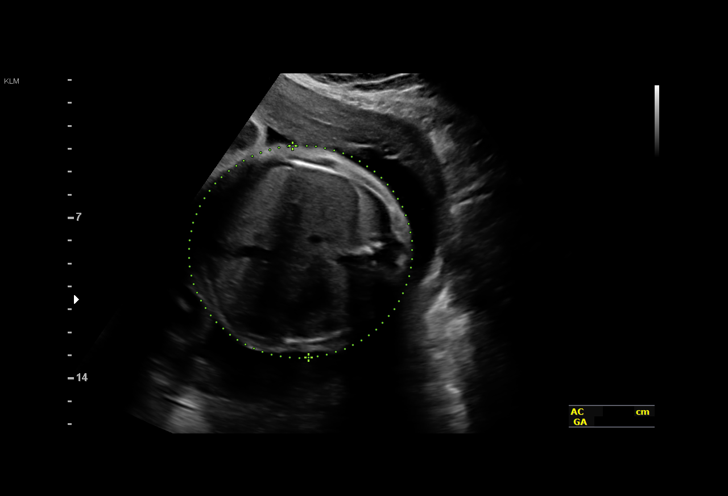
[im 19/30]
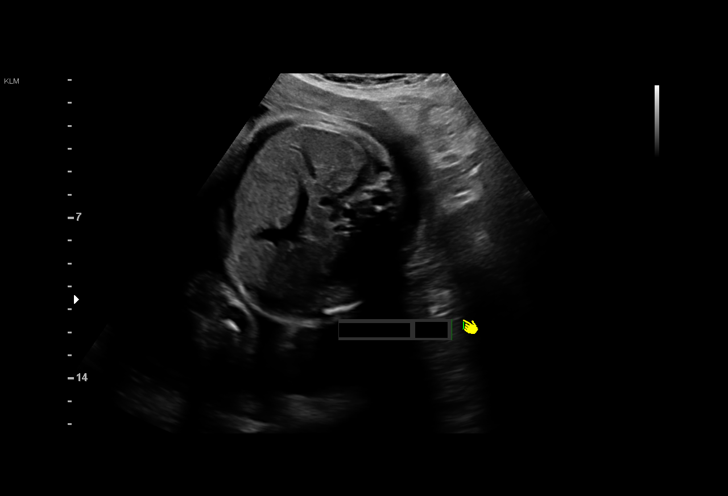
[im 21/30]
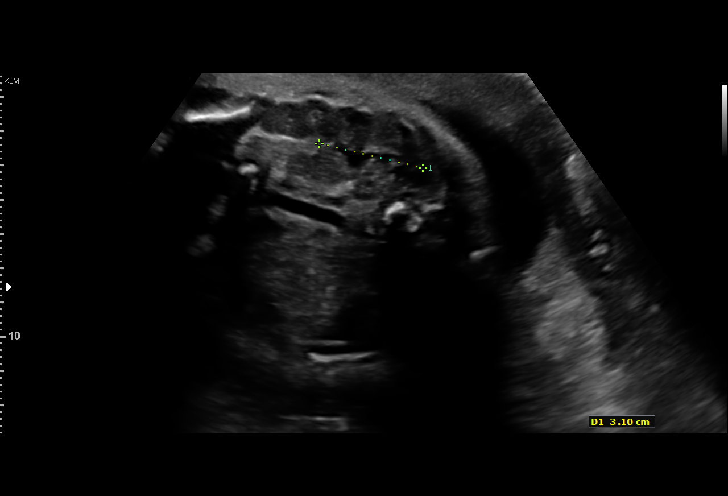
[im 23/30]
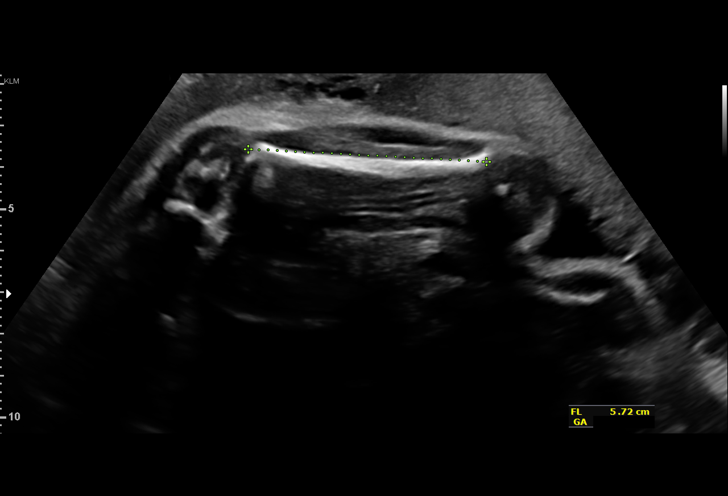
[im 25/30]
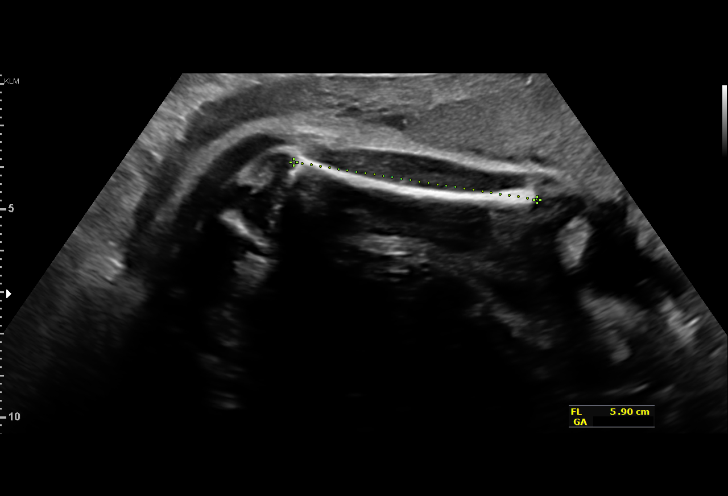
[im 27/30]
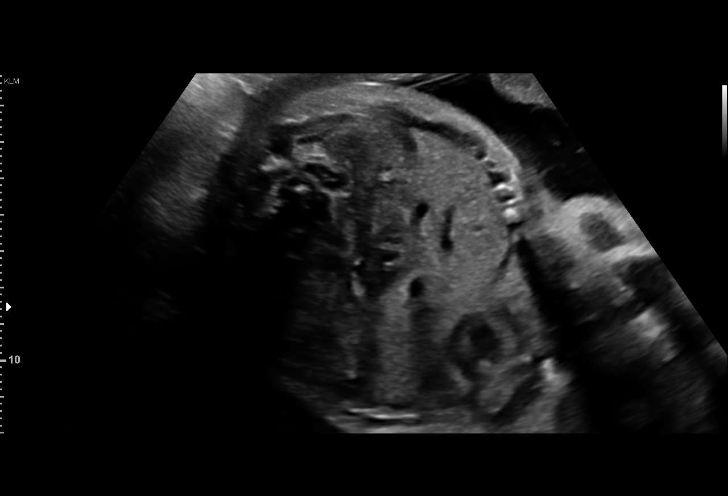
[im 30/30]
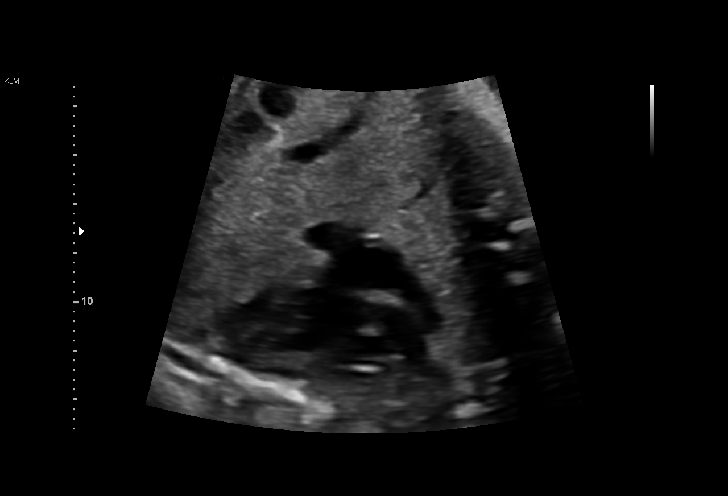

[14 of 28 positions shown; findings below may reference images not displayed]

OB/Gyn Clinic
[REDACTED]

1  VILBARD BONIFASIUS              [id number]      [id number]     [id number]
Indications

32 weeks gestation of pregnancy
Advanced maternal age primigravida 35+,
third trimester
Hypertension - Chronic/Pre-existing
Grand multiparity, antepartum
OB History

Blood Type:            Height:  5'5"   Weight (lb):  185      BMI:
Gravidity:    9         Term:   8        Prem:   0        SAB:   0
TOP:          0       Ectopic:  0        Living: 8
Fetal Evaluation

Num Of Fetuses:     1
Fetal Heart         149
Rate(bpm):
Cardiac Activity:   Observed
Presentation:       Cephalic
Placenta:           Anterior, above cervical os
P. Cord Insertion:  Visualized
Amniotic Fluid
AFI FV:      Subjectively within normal limits

AFI Sum(cm)     %Tile       Largest Pocket(cm)
16.08           58

RUQ(cm)       RLQ(cm)       LUQ(cm)        LLQ(cm)
6.24
Biometry

BPD:      76.5  mm     G. Age:  30w 5d          8  %    CI:        69.47   %   70 - 86
FL/HC:      19.8   %   19.1 -
HC:       293   mm     G. Age:  32w 2d         19  %    HC/AC:      0.99       0.96 -
AC:      296.8  mm     G. Age:  33w 5d         87  %    FL/BPD:     75.9   %   71 - 87
FL:       58.1  mm     G. Age:  30w 2d          6  %    FL/AC:      19.6   %   20 - 24

Est. FW:    2289  gm      4 lb 5 oz     60  %
Gestational Age

LMP:           32w 1d       Date:   04/02/16                 EDD:   01/07/17
U/S Today:     31w 5d                                        EDD:   01/10/17
Best:          32w 1d    Det. By:   LMP  (04/02/16)          EDD:   01/07/17
Anatomy

Cranium:               Appears normal         Aortic Arch:            Previously seen
Cavum:                 Previously seen        Ductal Arch:            Previously seen
Ventricles:            Appears normal         Diaphragm:              Appears normal
Choroid Plexus:        Previously seen        Stomach:                Appears normal, left
sided
Cerebellum:            Previously seen        Abdomen:                Appears normal
Posterior Fossa:       Previously seen        Abdominal Wall:         Previously seen
Nuchal Fold:           Previously seen        Cord Vessels:           Previously seen
Face:                  Orbits and profile     Kidneys:                Appear normal
previously seen
Lips:                  Previously seen        Bladder:                Appears normal
Thoracic:              Appears normal         Spine:                  Previously seen
Heart:                 Previously seen        Upper Extremities:      Previously seen
RVOT:                  Appears normal         Lower Extremities:      Previously seen
LVOT:                  Appears normal

Other:  Female gender previously seen. Both Heels and 5th digits previously
seen.
Cervix Uterus Adnexa

Cervix
Not visualized (advanced GA >34wks)
Impression

Single IUP at 32w 1d
Advanced maternal age, chronic hypertension
Normal interval anatomy
Fetal growth is appropriate (60th %tile)
Anterior placenta without previa
Normal amniotic fluid volume

BPs: 167/99; repeat 162/103
Recommendations

Patient was escorted to [facility] for further evaluation - rule out
preeclampsia
Recommend follow up in 4 weeks for interval growth

## 2018-06-29 ENCOUNTER — Encounter: Payer: Self-pay | Admitting: *Deleted

## 2019-01-14 ENCOUNTER — Encounter: Payer: Self-pay | Admitting: *Deleted

## 2019-04-23 ENCOUNTER — Telehealth: Payer: Self-pay | Admitting: Family Medicine

## 2019-04-23 NOTE — Telephone Encounter (Signed)
Attempted to reach patient with Interpreter [phone]. Was not able to leave a message.

## 2019-04-27 ENCOUNTER — Ambulatory Visit: Payer: BLUE CROSS/BLUE SHIELD

## 2019-05-19 ENCOUNTER — Ambulatory Visit: Payer: BC Managed Care – PPO | Admitting: General Practice

## 2019-05-19 ENCOUNTER — Ambulatory Visit: Payer: BC Managed Care – PPO

## 2019-05-19 ENCOUNTER — Other Ambulatory Visit: Payer: Self-pay

## 2019-05-19 DIAGNOSIS — Z3202 Encounter for pregnancy test, result negative: Secondary | ICD-10-CM

## 2019-05-19 NOTE — Progress Notes (Signed)
Patient seen and assessed by nursing staff during this encounter. I have reviewed the chart and agree with the documentation and plan.  Kerry Hough, PA-C 05/19/2019 11:12 AM

## 2019-05-19 NOTE — Progress Notes (Signed)
Patient was scheduled for UPT today. UPT -. Upon calling the patient back she states she thought the visit was for removal of her nexplanon today. Told patient her appt was scheduled as a nurse visit for a pregnancy test and removal could not be done today unfortunately. Patient did call and make the appt herself. Offered appt on 9/21 during after hours but she states she has to work at [employer]. Patient taken to front desk to reschedule appt appropriately. Patient had no questions. No charge added for UPT.   Koren Bound RN BSN 05/19/19

## 2019-06-02 ENCOUNTER — Telehealth: Payer: Self-pay | Admitting: Obstetrics and Gynecology

## 2019-06-02 NOTE — Telephone Encounter (Signed)
Attempted to contact patient w/ Swahili interpreter ID# [phone]  about her appointment on 10/1 @ 10:55. Interpreter stated that the number stated that is restricted or invaild.

## 2019-06-03 ENCOUNTER — Other Ambulatory Visit: Payer: Self-pay

## 2019-06-03 ENCOUNTER — Encounter: Payer: Self-pay | Admitting: Obstetrics and Gynecology

## 2019-06-03 ENCOUNTER — Ambulatory Visit (INDEPENDENT_AMBULATORY_CARE_PROVIDER_SITE_OTHER): Payer: BC Managed Care – PPO | Admitting: Obstetrics and Gynecology

## 2019-06-03 VITALS — BP 138/84 | HR 90

## 2019-06-03 DIAGNOSIS — Z3046 Encounter for surveillance of implantable subdermal contraceptive: Secondary | ICD-10-CM | POA: Diagnosis not present

## 2019-06-03 NOTE — Progress Notes (Signed)
     GYNECOLOGY OFFICE PROCEDURE NOTE  Monique Moon is a 43 y.o. [id number] here for Nexplanon removal.  Last pap smear was in 2017, and was normal.  No other gynecologic concerns.  Nexplanon Removal Patient identified, informed consent performed, consent signed.   Appropriate time out taken. Nexplanon site identified.  Area prepped in usual sterile fashon. One ml of 1% lidocaine was used to anesthetize the area at the distal end of the implant. A small stab incision was made right beside the implant on the distal portion.  The Nexplanon rod was grasped using hemostats and removed without difficulty.  There was minimal blood loss. There were no complications.  3 ml of 1% lidocaine was injected around the incision for post-procedure analgesia.  Steri-strips were applied over the small incision.  A pressure bandage was applied to reduce any bruising.  The patient tolerated the procedure well and was given post procedure instructions.  Patient is planning to become pregnant.     Natalie Mceuen, Artist Pais, [place] for [employer], [place]

## 2019-08-12 ENCOUNTER — Ambulatory Visit: Payer: BC Managed Care – PPO | Admitting: Obstetrics and Gynecology

## 2020-01-13 ENCOUNTER — Inpatient Hospital Stay (HOSPITAL_COMMUNITY)
Admission: AD | Admit: 2020-01-13 | Discharge: 2020-01-13 | Disposition: A | Discharge status: Home / Self Care | Payer: BC Managed Care – PPO | Attending: Obstetrics & Gynecology | Admitting: Obstetrics & Gynecology

## 2020-01-13 ENCOUNTER — Ambulatory Visit (INDEPENDENT_AMBULATORY_CARE_PROVIDER_SITE_OTHER): Payer: BC Managed Care – PPO | Admitting: Obstetrics and Gynecology

## 2020-01-13 ENCOUNTER — Encounter: Payer: Self-pay | Admitting: Obstetrics and Gynecology

## 2020-01-13 ENCOUNTER — Other Ambulatory Visit: Payer: Self-pay

## 2020-01-13 VITALS — BP 195/127 | HR 82 | Wt 194.3 lb

## 2020-01-13 DIAGNOSIS — Z3202 Encounter for pregnancy test, result negative: Secondary | ICD-10-CM | POA: Insufficient documentation

## 2020-01-13 DIAGNOSIS — R030 Elevated blood-pressure reading, without diagnosis of hypertension: Secondary | ICD-10-CM | POA: Diagnosis not present

## 2020-01-13 DIAGNOSIS — Z01419 Encounter for gynecological examination (general) (routine) without abnormal findings: Secondary | ICD-10-CM

## 2020-01-13 DIAGNOSIS — O0990 Supervision of high risk pregnancy, unspecified, unspecified trimester: Secondary | ICD-10-CM

## 2020-01-13 DIAGNOSIS — O3680X0 Pregnancy with inconclusive fetal viability, not applicable or unspecified: Secondary | ICD-10-CM

## 2020-01-13 LAB — URINALYSIS, ROUTINE W REFLEX MICROSCOPIC
Bilirubin Urine: NEGATIVE
Glucose, UA: NEGATIVE mg/dL
Hgb urine dipstick: NEGATIVE
Ketones, ur: NEGATIVE mg/dL
Leukocytes,Ua: NEGATIVE
Nitrite: NEGATIVE
Protein, ur: NEGATIVE mg/dL
Specific Gravity, Urine: 1.016 (ref 1.005–1.030)
pH: 7 (ref 5.0–8.0)

## 2020-01-13 LAB — PROTEIN / CREATININE RATIO, URINE
Creatinine, Urine: 125.62 mg/dL
Protein Creatinine Ratio: 0.12 mg/mg{Cre} (ref 0.00–0.15)
Total Protein, Urine: 15 mg/dL

## 2020-01-13 LAB — POCT PREGNANCY, URINE: Preg Test, Ur: NEGATIVE

## 2020-01-13 NOTE — MAU Provider Note (Signed)
Patient Monique Moon is 44 y.o.   [id number] here sent from clinic for presumed pre-e vs. CHTN work up after having extremely elevated pressures in clinic. Patient reports that she is 6 months pregnant based on her last period.  She denies any complaints/pain at this time. She has not felt any fetal movements.   Due to acuity of patient's BP in clinic, patient was immediately brought to the room, IV started and pre-e labs drawn. FHT undetectable by Doppler, so stat bedside US performed. Non-official bedside US shows nothing in the uterus and subsequent negative pregnancy test. At this point, patient's BP was normal and pre-eclampsia/GHTN was ruled out.   Patient will be rescheduled at the [facility] for a follow up gyn exam. Interpreter present at all times; patient understands plan of care.    Charlesetta Garibaldi Deundra Bard 01/13/2020, 12:20 PM

## 2020-01-13 NOTE — Discharge Instructions (Signed)

## 2020-01-13 NOTE — MAU Note (Signed)
Pt left without D/C instructions and signing AVS.

## 2020-01-13 NOTE — Progress Notes (Addendum)
Patient states she is pregnant about 6 months  Will do a UPT today and have her rescheduled for a new ob.  LMP estimated  Mid Nov 2020   No heart tones obtained  Edit 01/17/2020 No UPT obtained because of urgency to get patient to MAU  [place] waiver explained by interpreter and signed by patient put in cabinet to be scanned into chart.

## 2020-01-13 NOTE — Progress Notes (Signed)
[name] is a 44 y.o. female here in the office today for an annual exam. NP called in the room by CMA with BP reading of 195/127, patient reports she is 6 months pregnant.  No HA, no scotoma, no swelling. No prenatal care. Swahili interpretor used. Fetal HR 150's- documented and reported by CMA.  Negative clonus, normal reflexes  MAU notified of patient coming; Spoke to Susanne Borders, CNM Patient instructed to go directly to MAU. Patient being transported by [facility]. Interpretor to follow.   Venia Carbon I, NP 01/13/2020 11:07 AM

## 2020-01-13 NOTE — MAU Note (Addendum)
Pt presents to MAU from OB office due to elevated blood pressures. She was sent PIH workup, denies HA, visual changes and epigastric pain. Interpretor that was with patient reported that [place], CMA did not find FHT's in office. Provider documented FHT's in the 150s. Bedside US confirmed and UPT confirmed that pt was not pregnant.

## 2020-01-27 ENCOUNTER — Encounter: Payer: BC Managed Care – PPO | Admitting: Family Medicine

## 2020-02-21 ENCOUNTER — Telehealth: Payer: Self-pay | Admitting: *Deleted

## 2020-02-21 ENCOUNTER — Other Ambulatory Visit: Payer: Self-pay | Admitting: *Deleted

## 2020-02-21 ENCOUNTER — Ambulatory Visit: Payer: BC Managed Care – PPO | Admitting: Nurse Practitioner

## 2020-02-21 DIAGNOSIS — Z01419 Encounter for gynecological examination (general) (routine) without abnormal findings: Secondary | ICD-10-CM

## 2020-02-21 NOTE — Telephone Encounter (Signed)
Crissa missed her appointment for her annual exam/ papsmear. I called [employer] with [employer] Interpreter [phone] and heard message " the number you are calling is restricted or unavailable". Will send letter. Javell Blackburn,RN

## 2020-02-29 ENCOUNTER — Encounter: Payer: Self-pay | Admitting: *Deleted

## 2020-06-13 ENCOUNTER — Ambulatory Visit (INDEPENDENT_AMBULATORY_CARE_PROVIDER_SITE_OTHER): Payer: BC Managed Care – PPO

## 2020-06-13 ENCOUNTER — Other Ambulatory Visit: Payer: Self-pay

## 2020-06-13 VITALS — BP 118/75 | HR 96

## 2020-06-13 DIAGNOSIS — Z3202 Encounter for pregnancy test, result negative: Secondary | ICD-10-CM | POA: Diagnosis not present

## 2020-06-13 LAB — POCT PREGNANCY, URINE: Preg Test, Ur: NEGATIVE

## 2020-06-13 NOTE — Progress Notes (Signed)
Pt here today for pregnancy test.  Resulted negative.  Video Interpreter # [id number] informed pt that her results.   I also advised pt that I recommend that she can purchase an otc pregnancy test to confirm her pregnancy.  Pt reports that she has not had a period in about a year and does not know what is wrong.  I advised pt that I would recommend that she schedules an appt with a provider so that she can be evaluated.  Pt verbalized understanding with no further questions.   Addison Naegeli, RN  06/13/20

## 2020-06-13 NOTE — Progress Notes (Signed)
Chart reviewed for nurse visit. Agree with plan of care.   Judeth Horn, NP 06/13/2020 4:45 PM

## 2020-07-04 ENCOUNTER — Ambulatory Visit (INDEPENDENT_AMBULATORY_CARE_PROVIDER_SITE_OTHER): Payer: BC Managed Care – PPO | Admitting: Nurse Practitioner

## 2020-07-04 ENCOUNTER — Other Ambulatory Visit: Payer: Self-pay

## 2020-07-04 ENCOUNTER — Encounter: Payer: Self-pay | Admitting: Nurse Practitioner

## 2020-07-04 VITALS — BP 216/133 | HR 105 | Temp 105.0°F | Wt 193.4 lb

## 2020-07-04 DIAGNOSIS — N912 Amenorrhea, unspecified: Secondary | ICD-10-CM

## 2020-07-04 DIAGNOSIS — I10 Essential (primary) hypertension: Secondary | ICD-10-CM | POA: Diagnosis not present

## 2020-07-04 DIAGNOSIS — Z3202 Encounter for pregnancy test, result negative: Secondary | ICD-10-CM | POA: Diagnosis not present

## 2020-07-04 LAB — POCT PREGNANCY, URINE: Preg Test, Ur: NEGATIVE

## 2020-07-04 NOTE — Progress Notes (Signed)
Patient states she feels as if she is pregnant States since may she came here and wasn't pregnant but was 6  months late.now she feels like is pregnant again and still has not had a period.  Will run a UPT today. Blood pressure is 216/133 states she doesn't take medication for anything. Main concern is not having a period and feeling like she is preganat with nausea breast tenderness.

## 2020-07-04 NOTE — Progress Notes (Signed)
   GYNECOLOGY OFFICE VISIT NOTE   History:  44 y.o. [id number] here today for amenorrhea and thinks she is still pregnant.  Was seen in MAU in May 2021 and told she was not pregnant but she still has breast tenderness and her abdomen is growing . She denies any abnormal vaginal discharge, bleeding, pelvic pain or other concerns.   Past Medical History:  Diagnosis Date  . HTN (hypertension)   . Malaria     Past Surgical History:  Procedure Laterality Date  . NO PAST SURGERIES      The following portions of the patient's history were reviewed and updated as appropriate: allergies, current medications, past family history, past medical history, past social history, past surgical history and problem list.   Health Maintenance:  Normal pap and negative HRHPV on 08-16-16.  Has not had mammogram  Review of Systems:  Pertinent items noted in HPI and remainder of comprehensive ROS otherwise negative.  Objective:  Physical Exam BP (!) 216/133   Pulse (!) 105   Temp (!) 105 F (40.6 C)   Wt 193 lb 6.4 oz (87.7 kg)   BMI 32.18 kg/m  CONSTITUTIONAL: Well-developed, well-nourished female in no acute distress.  HENT:  Normocephalic, atraumatic. External right and left ear normal.  EYES: Conjunctivae and EOM are normal. Pupils are equal, round.  No scleral icterus.  NECK: Normal range of motion, supple, no masses SKIN: Skin is warm and dry. No rash noted. Not diaphoretic. No erythema. No pallor. NEUROLOGIC: Alert and oriented to person, place, and time. Normal muscle tone coordination. No cranial nerve deficit noted. PSYCHIATRIC: Normal mood and affect. Normal behavior. Normal judgment and thought content. CARDIOVASCULAR: Tachycardia noted RESPIRATORY: Effort and breath sounds normal, no problems with respiration noted ABDOMEN: Soft, no distention noted.   PELVIC: Deferred MUSCULOSKELETAL: Normal range of motion. No edema noted.  Labs and Imaging No results found.  Assessment & Plan:    1. Amenorrhea Discussed menopause as she has not had a menstrual cycle in one year.  Has 9 children who live with her.  Urine pregnancy test is negative.  Abdominal exam is normal - no fundus or fetal parts palpated. Language Barrier and interpreter present for the entire visit. Advised that [facility] will be drawn today and results called to her.  Declined an in person visit in one week for results and declines scheduling for annual exam with pap smear.  - Pregnancy, urine POC - [facility]  2. Uncontrolled hypertension Discussed with client that her pressure is severely high and high enough to have a stroke or heart attack.  Advised that she go to the mobile health clinic tonight before 6 pm for evaluation.  States she will go there today. Does not have any doctor that she sees outside of this practice.  Client was given BP meds 4 years ago but she never took any of them.  - Pregnancy, urine POC - [facility]   Routine preventative health maintenance measures emphasized. Please refer to After Visit Summary for other counseling recommendations.   Return in about 4 weeks (around 08/01/2020) for annual exam with pap.   Total face-to-face time with patient: 15 minutes.  Over 50% of encounter was spent on counseling and coordination of care.  Nolene Bernheim, RN, MSN, NP-BC Nurse Practitioner, [facility] for [employer], [facility], [facility], [facility] 07/04/2020 4:43 PM

## 2020-07-05 LAB — FOLLICLE STIMULATING HORMONE: FSH: 50 m[IU]/mL

## 2020-07-10 ENCOUNTER — Telehealth: Payer: Self-pay

## 2020-07-10 NOTE — Telephone Encounter (Addendum)
-----   Message from Currie Paris, NP sent at 07/07/2020  8:13 AM EDT ----- Call patient with interpreter.  She is menopausal and should not anticipate having more periods.  Make sure she went to see a doctor and has medicine for her severely high blood pressure.   Called pt with [facility] interpreter ID [phone]. Phone number on record is no longer in service. Called pt contact; case worker listed is not current, states they will contact current case worker for more recent phone number. Case worker will call the office with more updated number.

## 2020-08-03 ENCOUNTER — Ambulatory Visit: Payer: BC Managed Care – PPO | Admitting: Nurse Practitioner
